# Patient Record
Sex: Male | Born: 1995 | Hispanic: Yes | Marital: Married | Smoking: Never smoker
Health system: Southern US, Community
[De-identification: ages and names within clinical notes are randomized; demographics above are authoritative.]

## PROBLEM LIST (undated history)

## (undated) DIAGNOSIS — T4145XA Adverse effect of unspecified anesthetic, initial encounter: Secondary | ICD-10-CM

## (undated) DIAGNOSIS — M549 Dorsalgia, unspecified: Secondary | ICD-10-CM

## (undated) DIAGNOSIS — T8859XA Other complications of anesthesia, initial encounter: Secondary | ICD-10-CM

---
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
## 2015-04-14 ENCOUNTER — Other Ambulatory Visit: Payer: Self-pay | Admitting: Family Medicine

## 2015-04-14 ENCOUNTER — Ambulatory Visit
Admission: RE | Admit: 2015-04-14 | Discharge: 2015-04-14 | Disposition: A | Discharge status: Home / Self Care | Payer: BLUE CROSS/BLUE SHIELD | Source: Ambulatory Visit | Attending: Family Medicine | Admitting: Family Medicine

## 2015-04-14 DIAGNOSIS — S93402A Sprain of unspecified ligament of left ankle, initial encounter: Secondary | ICD-10-CM | POA: Insufficient documentation

## 2015-04-14 DIAGNOSIS — M25472 Effusion, left ankle: Secondary | ICD-10-CM | POA: Insufficient documentation

## 2017-07-02 IMAGING — CR DG ANKLE COMPLETE 3+V*L*
1 series · 3 of 3 positions shown · non-contrast
Comparison: None

CLINICAL DATA: Stepped in a hole while running around outside,
tripped, LEFT ankle sprain, initial encounter, pain primarily
medially

EXAM:
LEFT ANKLE COMPLETE - 3+ VIEW

[Series 1: dg ankle complete left · 0.14mm/px · 3 of 3 slices shown]
[im 1/3]
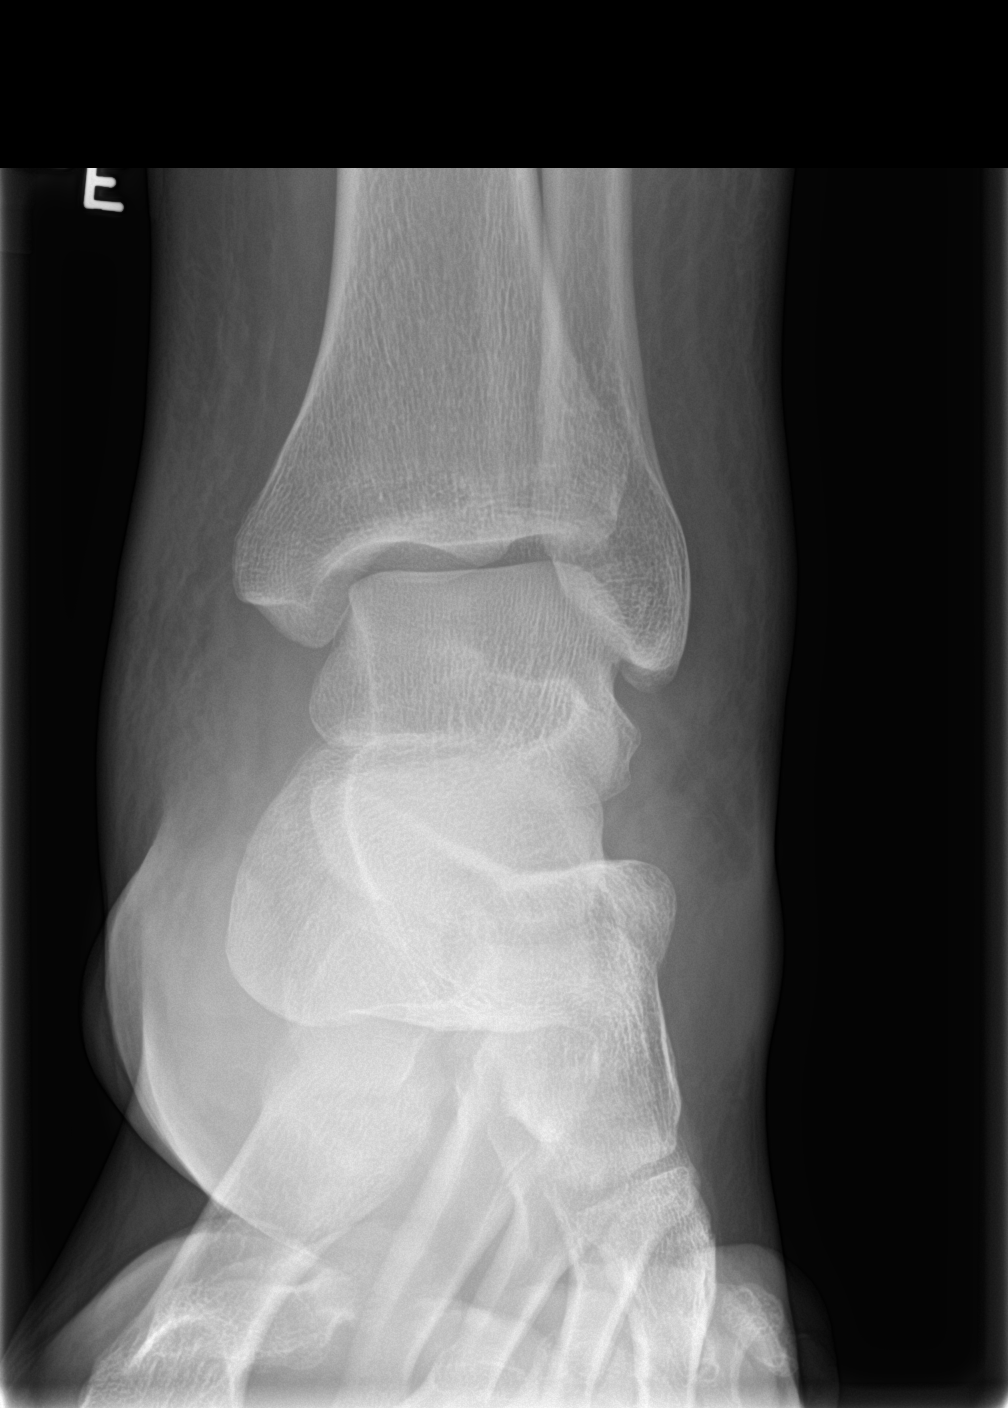
[im 2/3]
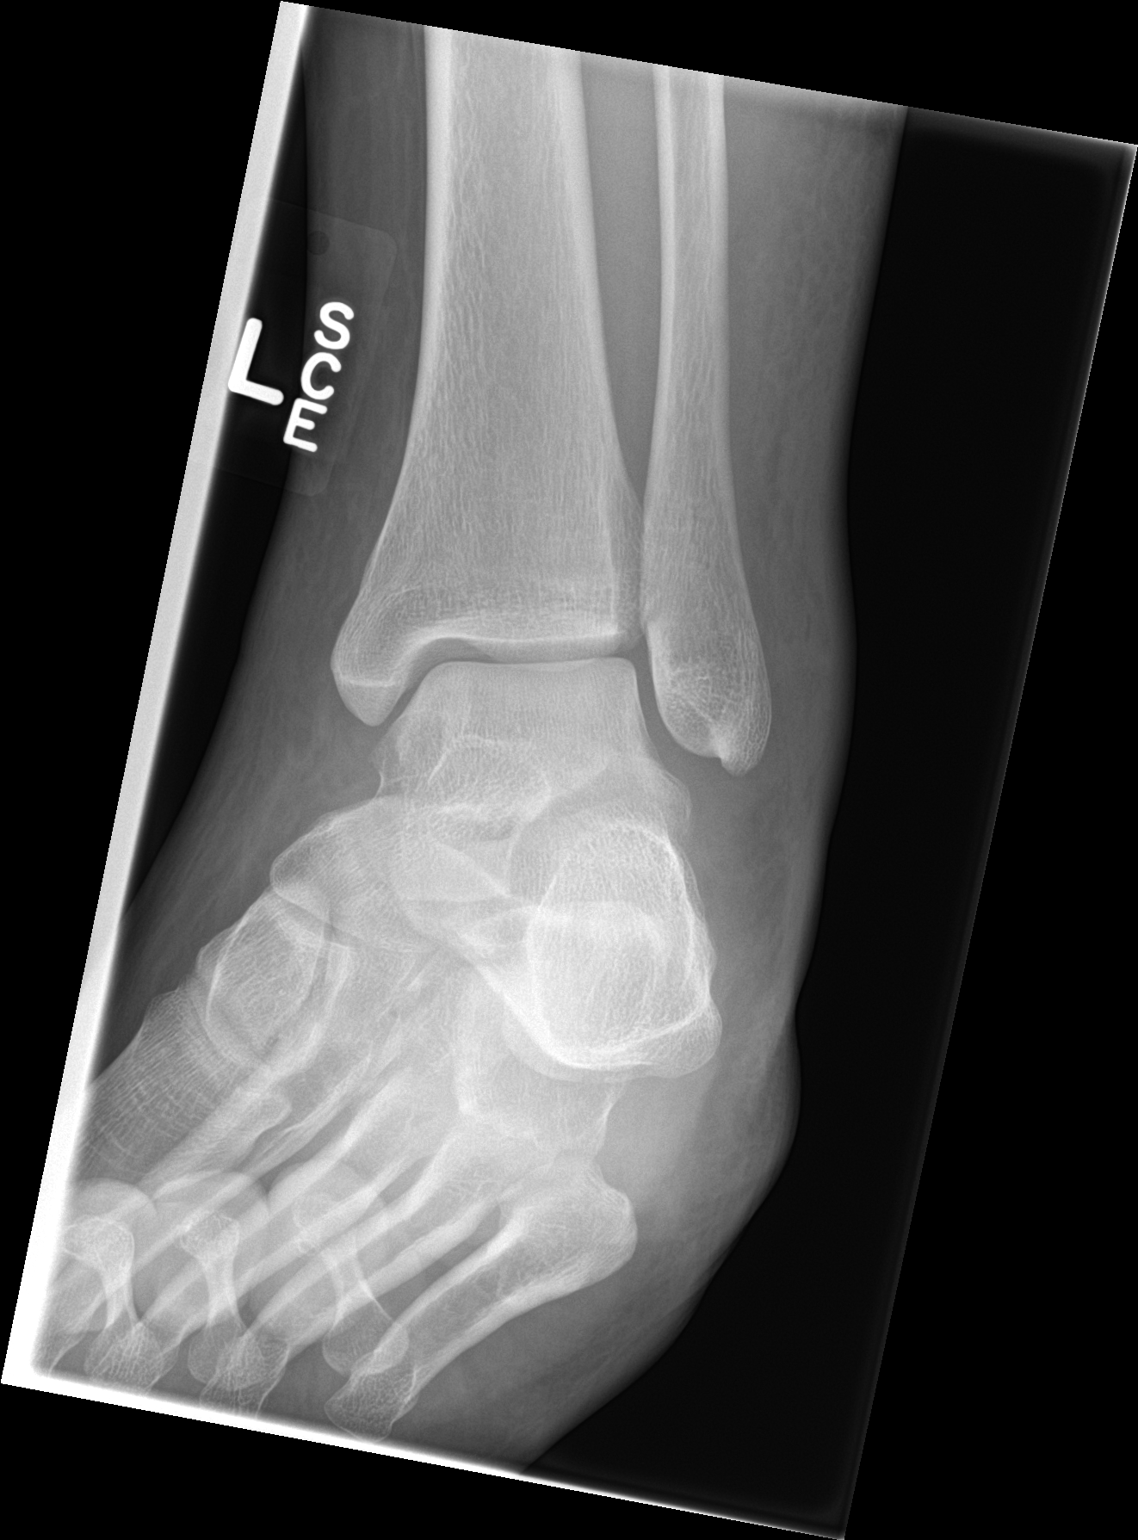
[im 3/3]
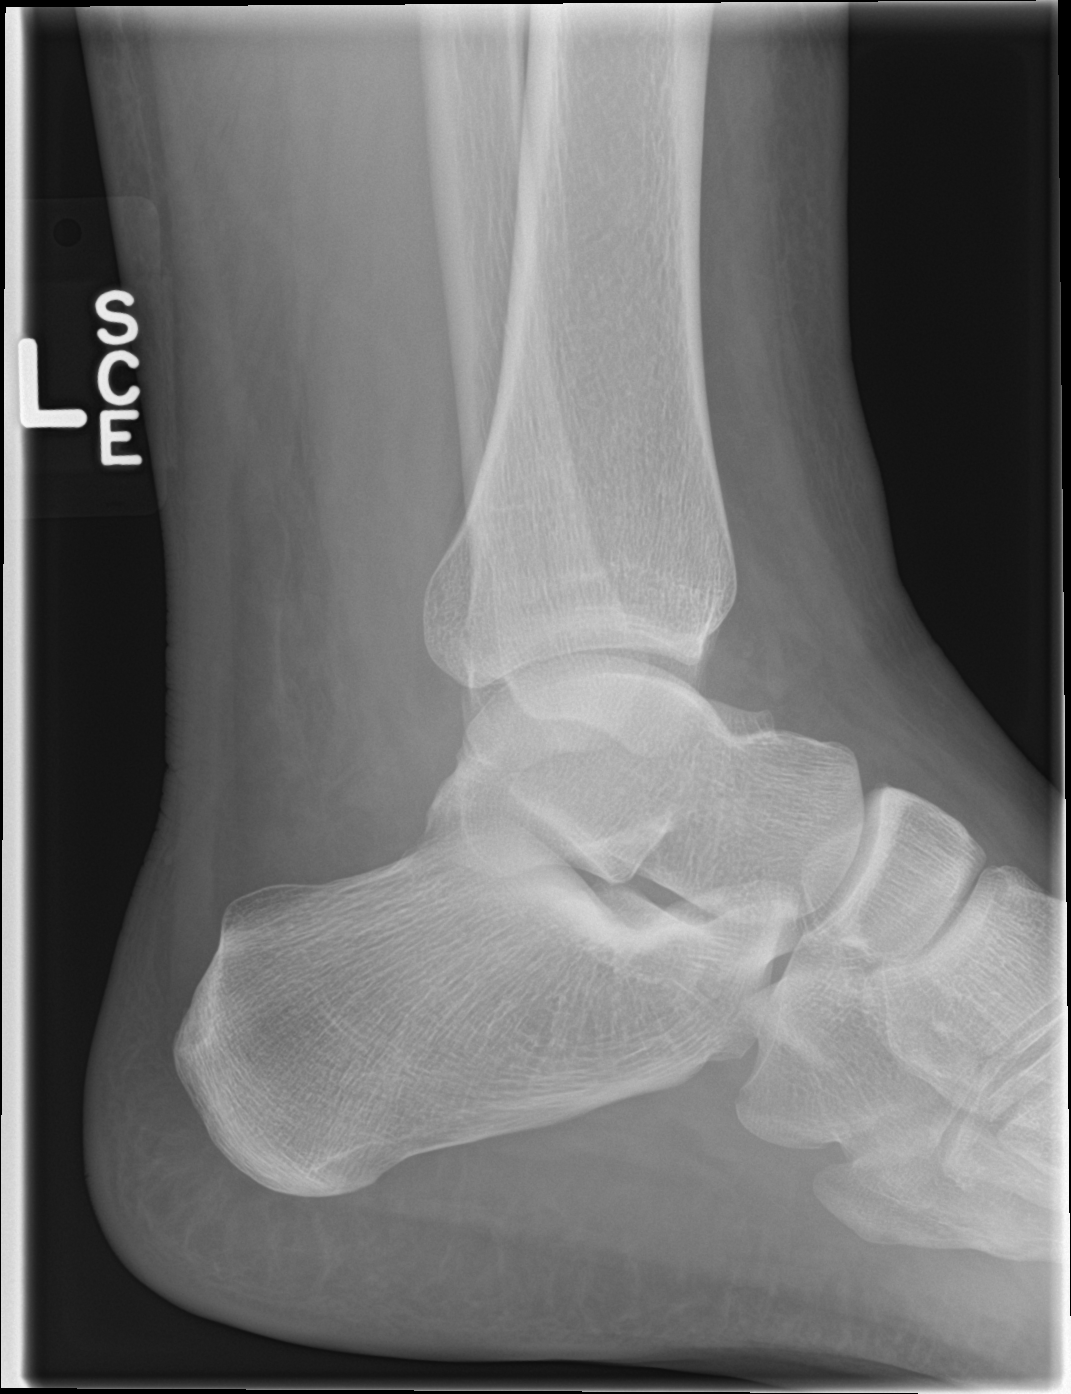

[3 of 3 positions shown; findings below may reference images not displayed]

FINDINGS: Diffuse soft tissue swelling LEFT ankle and distal lower leg.

Osseous mineralization normal.

Joint spaces preserved.

No acute fracture, dislocation, or bone destruction.
IMPRESSION: Soft tissue swelling without acute bony abnormalities.

## 2018-05-31 ENCOUNTER — Other Ambulatory Visit: Payer: Self-pay

## 2018-05-31 ENCOUNTER — Emergency Department
Admission: EM | Admit: 2018-05-31 | Discharge: 2018-06-01 | Disposition: A | Discharge status: Home / Self Care | Payer: BLUE CROSS/BLUE SHIELD | Attending: Emergency Medicine | Admitting: Emergency Medicine

## 2018-05-31 DIAGNOSIS — M62830 Muscle spasm of back: Secondary | ICD-10-CM | POA: Diagnosis not present

## 2018-05-31 DIAGNOSIS — M546 Pain in thoracic spine: Secondary | ICD-10-CM | POA: Diagnosis present

## 2018-05-31 MED ORDER — KETOROLAC TROMETHAMINE 30 MG/ML IJ SOLN
30.0000 mg | Freq: Once | INTRAMUSCULAR | Status: AC
  Administered 2018-06-01: 30 mg via INTRAMUSCULAR
  Filled 2018-05-31: qty 1

## 2018-05-31 MED ORDER — ORPHENADRINE CITRATE 30 MG/ML IJ SOLN
60.0000 mg | Freq: Once | INTRAMUSCULAR | Status: AC
  Administered 2018-06-01: 60 mg via INTRAMUSCULAR
  Filled 2018-05-31: qty 2

## 2018-05-31 NOTE — ED Notes (Signed)
Pt states that his upper back is in pain. 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 Family at bedside.

## 2018-05-31 NOTE — ED Provider Notes (Signed)
[facility] Emergency Department Provider Note  ____________________________________________  Time seen: Approximately 11:40 PM  I have reviewed the triage vital signs and the nursing notes.   HISTORY  Chief Complaint Back Pain    HPI Vincent Lopez is a 23 y.o. male who presents the emergency department complaining of mid back pain.  Patient reports that he has a tight/pulling sensation to the thoracic spine.  Patient reports that he does have intermittent back issues as he frequently lifts 50 to 85 pound objects at work.  Patient reports that typically he can rest his back, take Aleve and symptoms will improve.  Tonight symptoms persisted even after taking Aleve.  He denies any radicular symptoms in the upper or lower extremity, bowel or bladder dysfunction, saddle anesthesia or paresthesias.  No direct trauma to his back.  Patient denies any chest pain, shortness of breath, abdominal pain, urinary or GI symptoms.    History reviewed. No pertinent past medical history.  There are no active problems to display for this patient.   History reviewed. No pertinent surgical history.  Prior to Admission medications   Medication Sig Start Date End Date Taking? Authorizing Provider  meloxicam (MOBIC) 15 MG tablet Take 1 tablet (15 mg total) by mouth daily. 06/01/18   Dwaine Pringle, Delorise Royals, PA-C  methocarbamol (ROBAXIN) 500 MG tablet Take 1 tablet (500 mg total) by mouth 4 (four) times daily. 06/01/18   Stefon Ramthun, Delorise Royals, PA-C    Allergies Patient has no known allergies.  No family history on file.  Social History Social History   Tobacco Use  . Smoking status: Not on file  Substance Use Topics  . Alcohol use: Not on file  . Drug use: Not on file     Review of Systems  Constitutional: No fever/chills Eyes: No visual changes.  Cardiovascular: no chest pain. Respiratory: no cough. No SOB. Gastrointestinal: No abdominal pain.  No nausea, no  vomiting.  No diarrhea.  No constipation. Genitourinary: Negative for dysuria. No hematuria  Musculoskeletal: Positive for mid back pain with no radiation. Skin: Negative for rash, abrasions, lacerations, ecchymosis. Neurological: Negative for headaches, focal weakness or numbness. 10-point ROS otherwise negative.  ____________________________________________   PHYSICAL EXAM:  VITAL SIGNS: ED Triage Vitals  Enc Vitals Group     BP 05/31/18 2232 (!) 178/111     Pulse Rate 05/31/18 2232 77     Resp 05/31/18 2232 18     Temp 05/31/18 2232 98.1 F (36.7 C)     Temp Source 05/31/18 2232 Oral     SpO2 05/31/18 2232 100 %     Weight 05/31/18 2231 247 lb (112 kg)     Height 05/31/18 2231 5\' 8"  (1.727 m)     Head Circumference --      Peak Flow --      Pain Score 05/31/18 2230 10     Pain Loc --      Pain Edu? --      Excl. in [place]? --      Constitutional: Alert and oriented. Well appearing and in no acute distress. Eyes: Conjunctivae are normal. PERRL. EOMI. Head: Atraumatic. Neck: No stridor.  No cervical spine tenderness to palpation.  Cardiovascular: Normal rate, regular rhythm. Normal S1 and S2.  Good peripheral circulation. Respiratory: Normal respiratory effort without tachypnea or retractions. Lungs CTAB. Good air entry to the bases with no decreased or absent breath sounds. Gastrointestinal: Bowel sounds 4 quadrants. Soft and nontender to palpation. No guarding  or rigidity. No palpable masses. No distention. No CVA tenderness. Musculoskeletal: Full range of motion to all extremities. No gross deformities appreciated.  Visualization of the thoracic spine reveals no gross signs of trauma with edema, lacerations, abrasions or ecchymosis.  No deformity.  Full range of motion to the thoracic spine.  Patient reports diffuse tenderness throughout the thoracic spine both midline and paraspinal muscle region.  No palpable abnormality or step-off.  No specific point tenderness on  palpation.  Radial pulses intact bilaterally.  Dorsalis pedis pulse intact bilaterally.  Sensation intact all 4 digits with no acute deficits. Neurologic:  Normal speech and language. No gross focal neurologic deficits are appreciated.  Skin:  Skin is warm, dry and intact. No rash noted. Psychiatric: Mood and affect are normal. Speech and behavior are normal. Patient exhibits appropriate insight and judgement.   ____________________________________________   LABS (all labs ordered are listed, but only abnormal results are displayed)  Labs Reviewed - No data to display ____________________________________________  EKG   ____________________________________________  RADIOLOGY   No results found.  ____________________________________________    PROCEDURES  Procedure(s) performed:    Procedures    Medications  ketorolac (TORADOL) 30 MG/ML injection 30 mg (30 mg Intramuscular Given 06/01/18 0012)  orphenadrine (NORFLEX) injection 60 mg (60 mg Intramuscular Given 06/01/18 0012)     ____________________________________________   INITIAL IMPRESSION / ASSESSMENT AND PLAN / ED COURSE  Pertinent labs & imaging results that were available during my care of the patient were reviewed by me and considered in my medical decision making (see chart for details).  Review of the [place] CSRS was performed in accordance of the [facility] prior to dispensing any controlled drugs.      Patient's diagnosis is consistent with thoracic muscle spasm and strain.  Patient presents emergency department with thoracic back pain.  Patient performs routine heavy lifting at work.  There was no acute trauma.  No acute findings on exam.  No indication for imaging at this time.  Patient be treated some to medically with Toradol and Norflex in the emergency department and a prescription for meloxicam and Robaxin at home.  Follow-up primary care as needed..  Patient is given ED precautions to return to the ED for  any worsening or new symptoms.     ____________________________________________  FINAL CLINICAL IMPRESSION(S) / ED DIAGNOSES  Final diagnoses:  Muscle spasm of back      NEW MEDICATIONS STARTED DURING THIS VISIT:  ED Discharge Orders         Ordered    meloxicam (MOBIC) 15 MG tablet  Daily     06/01/18 0010    methocarbamol (ROBAXIN) 500 MG tablet  4 times daily     06/01/18 0010              This chart was dictated using voice recognition software/Dragon. Despite best efforts to proofread, errors can occur which can change the meaning. Any change was purely unintentional.    Racheal Patches, PA-C 06/01/18 0014    Phineas Semen, MD 06/01/18 [phone]

## 2018-05-31 NOTE — ED Triage Notes (Signed)
Patient reports upper back pain  

## 2018-06-01 ENCOUNTER — Encounter: Payer: Self-pay | Admitting: Emergency Medicine

## 2018-06-01 MED ORDER — METHOCARBAMOL 500 MG PO TABS: 500.0000 mg | ORAL_TABLET | Freq: Four times a day (QID) | ORAL | 0 refills | Status: DC

## 2018-06-01 MED ORDER — MELOXICAM 15 MG PO TABS: 15.0000 mg | ORAL_TABLET | Freq: Every day | ORAL | 0 refills | Status: DC

## 2019-02-21 ENCOUNTER — Emergency Department: Payer: BC Managed Care – PPO

## 2019-02-21 ENCOUNTER — Emergency Department
Admission: EM | Admit: 2019-02-21 | Discharge: 2019-02-21 | Disposition: A | Discharge status: Home / Self Care | Payer: BC Managed Care – PPO | Attending: Emergency Medicine | Admitting: Emergency Medicine

## 2019-02-21 ENCOUNTER — Encounter: Payer: Self-pay | Admitting: Emergency Medicine

## 2019-02-21 ENCOUNTER — Other Ambulatory Visit: Payer: Self-pay

## 2019-02-21 DIAGNOSIS — K8020 Calculus of gallbladder without cholecystitis without obstruction: Secondary | ICD-10-CM | POA: Diagnosis not present

## 2019-02-21 DIAGNOSIS — M5489 Other dorsalgia: Secondary | ICD-10-CM | POA: Diagnosis present

## 2019-02-21 DIAGNOSIS — R1011 Right upper quadrant pain: Secondary | ICD-10-CM | POA: Insufficient documentation

## 2019-02-21 DIAGNOSIS — M549 Dorsalgia, unspecified: Secondary | ICD-10-CM

## 2019-02-21 DIAGNOSIS — R1114 Bilious vomiting: Secondary | ICD-10-CM

## 2019-02-21 HISTORY — DX: Dorsalgia, unspecified: M54.9

## 2019-02-21 LAB — COMPREHENSIVE METABOLIC PANEL
ALT: 35 U/L (ref 0–44)
AST: 28 U/L (ref 15–41)
Albumin: 4.9 g/dL (ref 3.5–5.0)
Alkaline Phosphatase: 60 U/L (ref 38–126)
Anion gap: 11 (ref 5–15)
BUN: 9 mg/dL (ref 6–20)
CO2: 23 mmol/L (ref 22–32)
Calcium: 9.6 mg/dL (ref 8.9–10.3)
Chloride: 103 mmol/L (ref 98–111)
Creatinine, Ser: 0.72 mg/dL (ref 0.61–1.24)
GFR calc Af Amer: >60 mL/min (ref >60)
GFR calc non Af Amer: >60 mL/min (ref >60)
Glucose, Bld: 126 mg/dL — ABNORMAL HIGH (ref 70–99)
Potassium: 4 mmol/L (ref 3.5–5.1)
Sodium: 137 mmol/L (ref 135–145)
Total Bilirubin: 0.9 mg/dL (ref 0.3–1.2)
Total Protein: 8.6 g/dL — ABNORMAL HIGH (ref 6.5–8.1)

## 2019-02-21 LAB — CBC
HCT: 46 % (ref 39.0–52.0)
Hemoglobin: 16.2 g/dL (ref 13.0–17.0)
MCH: 29.9 pg (ref 26.0–34.0)
MCHC: 35.2 g/dL (ref 30.0–36.0)
MCV: 84.9 fL (ref 80.0–100.0)
Platelets: 273 10*3/uL (ref 150–400)
RBC: 5.42 MIL/uL (ref 4.22–5.81)
RDW: 12.9 % (ref 11.5–15.5)
WBC: 11 10*3/uL — ABNORMAL HIGH (ref 4.0–10.5)
nRBC: 0 % (ref 0.0–0.2)

## 2019-02-21 LAB — URINALYSIS, COMPLETE (UACMP) WITH MICROSCOPIC
Bacteria, UA: NONE SEEN
Bilirubin Urine: NEGATIVE
Glucose, UA: NEGATIVE mg/dL
Hgb urine dipstick: NEGATIVE
Ketones, ur: NEGATIVE mg/dL
Leukocytes,Ua: NEGATIVE
Nitrite: NEGATIVE
Protein, ur: 30 mg/dL — AB
Specific Gravity, Urine: 1.025 (ref 1.005–1.030)
Squamous Epithelial / HPF: NONE SEEN (ref 0–5)
pH: 6 (ref 5.0–8.0)

## 2019-02-21 LAB — LIPASE, BLOOD: Lipase: 23 U/L (ref 11–51)

## 2019-02-21 MED ORDER — ONDANSETRON 4 MG PO TBDP: 4.0000 mg | ORAL_TABLET | Freq: Three times a day (TID) | ORAL | 0 refills | Status: DC | PRN

## 2019-02-21 MED ORDER — SODIUM CHLORIDE 0.9% FLUSH: 3.0000 mL | Freq: Once | INTRAVENOUS | Status: DC

## 2019-02-21 MED ORDER — MELOXICAM 15 MG PO TABS: 15.0000 mg | ORAL_TABLET | Freq: Every day | ORAL | 0 refills | Status: DC

## 2019-02-21 NOTE — ED Notes (Signed)
Pt not able to provide urine sample at this time. Pt given cup for specimen and told to collect one when able.

## 2019-02-21 NOTE — ED Triage Notes (Signed)
Pt to ED via POV stating that he went to [facility] for upper back pain and abdominal pain. Pt states that he has intermittent back pain and has been seen for this before, pt took flexeril this morning but it has not helped. Pt states that he is also having upper abdominal pain that started this morning and states that he vomited x 5-6 times. Pt states that he had cold chills and was sweating. Pt is in NAD at this time.

## 2019-02-21 NOTE — ED Provider Notes (Signed)
[facility] Emergency Department Provider Note  ____________________________________________  Time seen: Approximately 2:46 PM  I have reviewed the triage vital signs and the nursing notes.   HISTORY  Chief Complaint Back Pain and Abdominal Pain    HPI Vincent Lopez is a 23 y.o. male who presents to the emergency department complaining of right upper quadrant abdominal pain and back pain.  Patient states that he has a history of recurrent back pain, which has been "flared" for the past 2 to 3 days.   Patient states that he typically takes Flexeril and the pain goes away.  As the pain was not improving and he started to develop abdominal pain he was seen in urgent care.  Patient was sent to the emergency department for further evaluation.  Patient has had 5-6 episodes of bilious vomit today.  No history of GI problems.  Patient denies any history of gallbladder issues.  Patient denies any other abdominal pain.  No fevers or chills.  Patient denies any respiratory symptoms.  Patient denies any hematemesis.  No diarrhea or constipation.  Other than right upper quadrant pain no other abdominal pain.  Patient denies any dysuria, polyuria, hematuria.  No radicular symptoms in the upper or lower extremities.  Patient took Flexeril today but no other medications for this symptom.        Past Medical History:  Diagnosis Date  . Back pain     There are no active problems to display for this patient.   History reviewed. No pertinent surgical history.  Prior to Admission medications   Medication Sig Start Date End Date Taking? Authorizing Provider  meloxicam (MOBIC) 15 MG tablet Take 1 tablet (15 mg total) by mouth daily. 02/21/19   Aaiden Depoy, Delorise Royals, PA-C  methocarbamol (ROBAXIN) 500 MG tablet Take 1 tablet (500 mg total) by mouth 4 (four) times daily. 06/01/18   Savi Lastinger, Delorise Royals, PA-C  ondansetron (ZOFRAN-ODT) 4 MG disintegrating tablet Take 1 tablet (4 mg  total) by mouth every 8 (eight) hours as needed for nausea or vomiting. 02/21/19   Lyna Laningham, Delorise Royals, PA-C    Allergies Patient has no known allergies.  No family history on file.  Social History Social History   Tobacco Use  . Smoking status: Not on file  . Smokeless tobacco: Never Used  Substance Use Topics  . Alcohol use: Yes  . Drug use: Yes    Types: Marijuana    Comment: Occasional     Review of Systems  Constitutional: No fever/chills Eyes: No visual changes. No discharge ENT: No upper respiratory complaints. Cardiovascular: no chest pain. Respiratory: no cough. No SOB. Gastrointestinal: Right upper quadrant abdominal pain.  Positive for nausea with bilious vomit today.  No diarrhea.  No constipation. Genitourinary: Negative for dysuria. No hematuria Musculoskeletal: Positive for right-sided middle back pain Skin: Negative for rash, abrasions, lacerations, ecchymosis. Neurological: Negative for headaches, focal weakness or numbness. 10-point ROS otherwise negative.  ____________________________________________   PHYSICAL EXAM:  VITAL SIGNS: ED Triage Vitals  Enc Vitals Group     BP 02/21/19 1217 (!) 158/82     Pulse Rate 02/21/19 1217 76     Resp 02/21/19 1217 16     Temp 02/21/19 1217 97.7 F (36.5 C)     Temp Source 02/21/19 1217 Oral     SpO2 02/21/19 1217 99 %     Weight 02/21/19 1214 230 lb (104.3 kg)     Height 02/21/19 1214 5\' 8"  (1.727 m)  Head Circumference --      Peak Flow --      Pain Score 02/21/19 1214 7     Pain Loc --      Pain Edu? --      Excl. in [place]? --      Constitutional: Alert and oriented. Well appearing and in no acute distress. Eyes: Conjunctivae are normal. PERRL. EOMI. Head: Atraumatic. ENT:      Ears:       Nose: No congestion/rhinnorhea.      Mouth/Throat: Mucous membranes are moist.  Neck: No stridor.  Neck supple full range of motion Hematological/Lymphatic/Immunilogical: No cervical  lymphadenopathy. Cardiovascular: Normal rate, regular rhythm. Normal S1 and S2.  Good peripheral circulation. Respiratory: Normal respiratory effort without tachypnea or retractions. Lungs CTAB. Good air entry to the bases with no decreased or absent breath sounds. Gastrointestinal: No visible abdominal wall findings.  Bowel sounds 4 quadrants. Soft to palpation all quadrants.  Patient is tender to palpation in the right upper quadrant.  No other tenderness to palpation.  Positive Murphy sign.. No guarding or rigidity. No palpable masses. No distention. No CVA tenderness. Musculoskeletal: Full range of motion to all extremities. No gross deformities appreciated.  Examination of the back reveals no visible signs of trauma.  Full range of motion to the cervical, thoracic and lumbar spine. Neurologic:  Normal speech and language. No gross focal neurologic deficits are appreciated.  Skin:  Skin is warm, dry and intact. No rash noted. Psychiatric: Mood and affect are normal. Speech and behavior are normal. Patient exhibits appropriate insight and judgement.   ____________________________________________   LABS (all labs ordered are listed, but only abnormal results are displayed)  Labs Reviewed  COMPREHENSIVE METABOLIC PANEL - Abnormal; Notable for the following components:      Result Value   Glucose, Bld 126 (*)    Total Protein 8.6 (*)    All other components within normal limits  CBC - Abnormal; Notable for the following components:   WBC 11.0 (*)    All other components within normal limits  URINALYSIS, COMPLETE (UACMP) WITH MICROSCOPIC - Abnormal; Notable for the following components:   Color, Urine YELLOW (*)    APPearance CLEAR (*)    Protein, ur 30 (*)    All other components within normal limits  LIPASE, BLOOD   ____________________________________________  EKG   ____________________________________________  RADIOLOGY I personally viewed and evaluated these images as part  of my medical decision making, as well as reviewing the written report by the radiologist.  US Abdomen Limited Ruq  Result Date: 02/21/2019 CLINICAL DATA:  Right upper quadrant pain and vomiting EXAM: ULTRASOUND ABDOMEN LIMITED RIGHT UPPER QUADRANT COMPARISON:  None. FINDINGS: Gallbladder: There is a small amount of gallbladder sludge and small stones. No gallbladder wall thickening. No sonographic Murphy sign noted by sonographer. Common bile duct: Diameter: 0.4 cm. Liver: No focal lesion identified. Within normal limits in parenchymal echogenicity. Portal vein is patent on color Doppler imaging with normal direction of blood flow towards the liver. Other: None. IMPRESSION: Cholelithiasis without other evidence of cholecystitis. Electronically Signed   By: Audie Pinto M.D.   On: 02/21/2019 15:46    ____________________________________________    PROCEDURES  Procedure(s) performed:    Procedures    Medications  sodium chloride flush (NS) 0.9 % injection 3 mL (has no administration in time range)     ____________________________________________   INITIAL IMPRESSION / ASSESSMENT AND PLAN / ED COURSE  Pertinent labs & imaging  results that were available during my care of the patient were reviewed by me and considered in my medical decision making (see chart for details).  Review of the [place] CSRS was performed in accordance of the [facility] prior to dispensing any controlled drugs.           Patient's diagnosis is consistent with cholelithiasis without cholecystitis and back pain.  Patient presents emergency department for evaluation from urgent care for abdominal pain, emesis and back pain.  On exam, patient was tender with positive Murphy sign to the right upper quadrant.  With bilious vomit and symptoms I suspected gallbladder etiology.  Differential included gastritis, pancreatitis, cholecystitis, cholelithiasis.  Ultrasound reveals findings consistent with cholelithiasis  without cholecystitis.  Patient has had no return of emesis or significant abdominal pain in the emergency department.  Patient will be prescribed Zofran for symptom relief.  Meloxicam to assist with both back pain and pain from cholelithiasis.  Discussed follow-up to include elective surgery if patient had worsening symptoms, however discussed foods to avoid or improve symptoms.  Patient is given instructions to return to emergency department for any worsening pain, inability to maintain oral hydration or solids, fevers.  Patient verbalizes understanding of same.  Patient to follow-up with general surgery or primary care as needed.  Patient is given ED precautions to return to the ED for any worsening or new symptoms.     ____________________________________________  FINAL CLINICAL IMPRESSION(S) / ED DIAGNOSES  Final diagnoses:  Bilious vomiting  RUQ pain  Calculus of gallbladder without cholecystitis without obstruction  Mid back pain on right side      NEW MEDICATIONS STARTED DURING THIS VISIT:  ED Discharge Orders         Ordered    ondansetron (ZOFRAN-ODT) 4 MG disintegrating tablet  Every 8 hours PRN     02/21/19 1614    meloxicam (MOBIC) 15 MG tablet  Daily     02/21/19 1614              This chart was dictated using voice recognition software/Dragon. Despite best efforts to proofread, errors can occur which can change the meaning. Any change was purely unintentional.    Racheal PatchesCuthriell, Jeniel Slauson D, PA-C 02/21/19 1650    Sharman CheekStafford, Phillip, MD 02/25/19 [phone]

## 2019-02-21 NOTE — ED Notes (Signed)
ED Provider at bedside. 

## 2019-02-21 NOTE — ED Notes (Signed)
Pt c/o chills/sweats, back pain (hx of same), and abd pain with emesis today. Patient ambulatory into room with NAD noted

## 2019-04-16 HISTORY — PX: CHOLECYSTECTOMY: SHX55

## 2019-08-19 ENCOUNTER — Other Ambulatory Visit: Payer: Self-pay | Admitting: Radiology

## 2019-08-19 ENCOUNTER — Ambulatory Visit (INDEPENDENT_AMBULATORY_CARE_PROVIDER_SITE_OTHER): Payer: BC Managed Care – PPO | Admitting: Urology

## 2019-08-19 ENCOUNTER — Other Ambulatory Visit: Payer: Self-pay

## 2019-08-19 VITALS — BP 148/77 | HR 69 | Ht 68.0 in | Wt 241.0 lb

## 2019-08-19 DIAGNOSIS — N471 Phimosis: Secondary | ICD-10-CM

## 2019-08-19 NOTE — Progress Notes (Signed)
   08/19/2019 4:00 PM   Vincent Lopez 04/21/1995 [id number]  Referring provider: Aycock, Ngwe A, MD [address] [place],  [place] [address]  Chief Complaint  Patient presents with  . Paraphimosis    HPI: 23 yo male who states towards the end of 2020 he has noticed progressive tightness of his foreskin with difficulty retracting.  After intercourse he will also have difficulty replacing the foreskin back over the head of the penis.  He also notes some pain with intercourse.  He does note occasional fissuring and irritation.  No history of diabetes.  Denies prior history of balanitis.  No voiding complaints.  Urologic history.   PMH: Past Medical History:  Diagnosis Date  . Back pain     Surgical History: No past surgical history on file.  Home Medications:  Allergies as of 08/19/2019   No Known Allergies     Medication List       Accurate as of Aug 19, 2019  4:00 PM. If you have any questions, ask your nurse or doctor.        clotrimazole 1 % cream Commonly known as: LOTRIMIN APPLY CREAM TOPICALLY TO AFFECTED AREA TWICE DAILY FOR FUNGAL INFECTION   meloxicam 15 MG tablet Commonly known as: MOBIC Take 1 tablet (15 mg total) by mouth daily.   methocarbamol 500 MG tablet Commonly known as: Robaxin Take 1 tablet (500 mg total) by mouth 4 (four) times daily.   omeprazole 40 MG capsule Commonly known as: PRILOSEC Take 40 mg by mouth daily.   ondansetron 4 MG disintegrating tablet Commonly known as: ZOFRAN-ODT Take 1 tablet (4 mg total) by mouth every 8 (eight) hours as needed for nausea or vomiting.       Allergies: No Known Allergies  Family History: No family history on file.  Social History:  does not have a smoking history on file. He has never used smokeless tobacco. He reports current alcohol use. He reports current drug use. Drug: Marijuana.   Physical Exam: BP (!) 148/77   Pulse 69   Ht 5' 8" (1.727 m)   Wt 241 lb (109.3 kg)   BMI  36.64 kg/m   Constitutional:  Alert and oriented, No acute distress. HEENT: [place] AT, moist mucus membranes.  Trachea midline, no masses. Cardiovascular: No clubbing, cyanosis, or edema.  RRR Respiratory: Normal respiratory effort, no increased work of breathing.  Clear GU: Phallus uncircumcised.  Foreskin is tight but retractable.  Inner prepuce blanched appearing.  Testes descended bilaterally without masses or tenderness. Skin: No rashes, bruises or suspicious lesions. Neurologic: Grossly intact, no focal deficits, moving all 4 extremities. Psychiatric: Normal mood and affect.   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  We discussed options of observation, topical steroids and circumcision.    He would like to schedule circumcision.  The procedure was discussed in detail including potential risks of bleeding, infection and scar tissue.  The postoperative care and follow-up was discussed including abstinence for 30 days after the procedure.  He indicated all questions were answered and desires to proceed.   [name], MD  [facility] [address], [address] [place], [place] [address] [phone]  

## 2019-08-19 NOTE — H&P (View-Only) (Signed)
   08/19/2019 4:00 PM   Vincent Lopez [birth year] [id number]  Referring provider: Emogene Morgan, MD [address] [place],  [place] [address]  Chief Complaint  Patient presents with  . Paraphimosis    HPI: 24 yo male who states towards the end of 2020 he has noticed progressive tightness of his foreskin with difficulty retracting.  After intercourse he will also have difficulty replacing the foreskin back over the head of the penis.  He also notes some pain with intercourse.  He does note occasional fissuring and irritation.  No history of diabetes.  Denies prior history of balanitis.  No voiding complaints.  Urologic history.   PMH: Past Medical History:  Diagnosis Date  . Back pain     Surgical History: No past surgical history on file.  Home Medications:  Allergies as of 08/19/2019   No Known Allergies     Medication List       Accurate as of Aug 19, 2019  4:00 PM. If you have any questions, ask your nurse or doctor.        clotrimazole 1 % cream Commonly known as: LOTRIMIN APPLY CREAM TOPICALLY TO AFFECTED AREA TWICE DAILY FOR FUNGAL INFECTION   meloxicam 15 MG tablet Commonly known as: MOBIC Take 1 tablet (15 mg total) by mouth daily.   methocarbamol 500 MG tablet Commonly known as: Robaxin Take 1 tablet (500 mg total) by mouth 4 (four) times daily.   omeprazole 40 MG capsule Commonly known as: PRILOSEC Take 40 mg by mouth daily.   ondansetron 4 MG disintegrating tablet Commonly known as: ZOFRAN-ODT Take 1 tablet (4 mg total) by mouth every 8 (eight) hours as needed for nausea or vomiting.       Allergies: No Known Allergies  Family History: No family history on file.  Social History:  does not have a smoking history on file. He has never used smokeless tobacco. He reports current alcohol use. He reports current drug use. Drug: Marijuana.   Physical Exam: BP (!) 148/77   Pulse 69   Ht 5\' 8"  (1.727 m)   Wt 241 lb (109.3 kg)   BMI  36.64 kg/m   Constitutional:  Alert and oriented, No acute distress. HEENT: [place] AT, moist mucus membranes.  Trachea midline, no masses. Cardiovascular: No clubbing, cyanosis, or edema.  RRR Respiratory: Normal respiratory effort, no increased work of breathing.  Clear GU: Phallus uncircumcised.  Foreskin is tight but retractable.  Inner prepuce blanched appearing.  Testes descended bilaterally without masses or tenderness. Skin: No rashes, bruises or suspicious lesions. Neurologic: Grossly intact, no focal deficits, moving all 4 extremities. Psychiatric: Normal mood and affect.   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  We discussed options of observation, topical steroids and circumcision.    He would like to schedule circumcision.  The procedure was discussed in detail including potential risks of bleeding, infection and scar tissue.  The postoperative care and follow-up was discussed including abstinence for 30 days after the procedure.  He indicated all questions were answered and desires to proceed.   , MD  [facility] [address], [address] [place], [place] [phone]

## 2019-08-20 ENCOUNTER — Encounter: Payer: Self-pay | Admitting: Urology

## 2019-08-26 ENCOUNTER — Encounter
Admission: RE | Admit: 2019-08-26 | Discharge: 2019-08-26 | Disposition: A | Discharge status: Home / Self Care | Payer: BC Managed Care – PPO | Source: Ambulatory Visit | Attending: Urology | Admitting: Urology

## 2019-08-26 ENCOUNTER — Other Ambulatory Visit: Payer: Self-pay

## 2019-08-26 HISTORY — DX: Other complications of anesthesia, initial encounter: T88.59XA

## 2019-08-26 NOTE — Patient Instructions (Signed)
Your procedure is scheduled on: Tuesday Aug 31, 2019 Report to Day Surgery. To find out your arrival time please call [phone] between 1PM - 3PM on Monday 17, 2021.  Remember: Instructions that are not followed completely may result in serious medical risk,  up to and including death, or upon the discretion of your surgeon and anesthesiologist your  surgery may need to be rescheduled.     _X__ 1. Do not eat food after midnight the night before your procedure.                 No gum chewing or hard candies. You may drink clear liquids up to 2 hours                 before you are scheduled to arrive for your surgery- DO not drink clear                 liquids within 2 hours of the start of your surgery.                 Clear Liquids include:  water, apple juice without pulp, clear Gatorade, G2 or                  Gatorade Zero (avoid Red/Purple/Blue), Black Coffee or Tea (Do not add                 anything to coffee or tea).  __X__2.  On the morning of surgery brush your teeth with toothpaste and water, you                may rinse your mouth with mouthwash if you wish.  Do not swallow any toothpaste of mouthwash.     _X__ 3.  No Alcohol for 24 hours before or after surgery.   _X__ 4.  Do Not Smoke or use e-cigarettes For 24 Hours Prior to Your Surgery.                 Do not use any chewable tobacco products for at least 6 hours prior to                 Surgery.  _X__  5.  Do not use any recreational drugs (marijuana, cocaine, heroin, ecstacy, MDMA or other)                For at least one week prior to your surgery.  Combination of these drugs with anesthesia                May have life threatening results.  __x__  6.  Notify your doctor if there is any change in your medical condition      (cold, fever, infections).     Do not wear jewelry, make-up, hairpins, clips or nail polish. Do not wear lotions, powders, or perfumes. You may wear deodorant. Do  not shave 48 hours prior to surgery. Men may shave face and neck. Do not bring valuables to the hospital.    [facility] is not responsible for any belongings or valuables.  Contacts, dentures or bridgework may not be worn into surgery. Leave your suitcase in the car. After surgery it may be brought to your room. For patients admitted to the hospital, discharge time is determined by your treatment team.   Patients discharged the day of surgery will not be allowed to drive home.   Make arrangements for someone to be with you for the first  24 hours of your Same Day Discharge.     __x__ Take these medicines the morning of surgery with A SIP OF WATER:    1. None   __x__ Stop Anti-inflammatories    __x__ Stop supplements until after surgery.    __x__ Do not start any herbal supplements before your surgery.

## 2019-08-27 ENCOUNTER — Other Ambulatory Visit
Admission: RE | Admit: 2019-08-27 | Discharge: 2019-08-27 | Disposition: A | Discharge status: Home / Self Care | Payer: BC Managed Care – PPO | Source: Ambulatory Visit | Attending: Urology | Admitting: Urology

## 2019-08-27 DIAGNOSIS — Z01812 Encounter for preprocedural laboratory examination: Secondary | ICD-10-CM | POA: Diagnosis present

## 2019-08-27 DIAGNOSIS — Z20822 Contact with and (suspected) exposure to covid-19: Secondary | ICD-10-CM | POA: Diagnosis not present

## 2019-08-27 LAB — SARS CORONAVIRUS 2 (TAT 6-24 HRS): SARS Coronavirus 2: NEGATIVE

## 2019-08-31 ENCOUNTER — Other Ambulatory Visit: Payer: Self-pay

## 2019-08-31 ENCOUNTER — Telehealth: Payer: Self-pay | Admitting: Radiology

## 2019-08-31 ENCOUNTER — Ambulatory Visit: Payer: BC Managed Care – PPO | Admitting: Anesthesiology

## 2019-08-31 ENCOUNTER — Encounter
Admission: RE | Disposition: A | Discharge status: Home / Self Care | Payer: Self-pay | Source: Home / Self Care | Attending: Urology

## 2019-08-31 ENCOUNTER — Ambulatory Visit
Admission: RE | Admit: 2019-08-31 | Discharge: 2019-08-31 | Disposition: A | Discharge status: Home / Self Care | Payer: BC Managed Care – PPO | Attending: Urology | Admitting: Urology

## 2019-08-31 ENCOUNTER — Encounter: Payer: Self-pay | Admitting: Urology

## 2019-08-31 DIAGNOSIS — N471 Phimosis: Secondary | ICD-10-CM | POA: Insufficient documentation

## 2019-08-31 DIAGNOSIS — L900 Lichen sclerosus et atrophicus: Secondary | ICD-10-CM | POA: Insufficient documentation

## 2019-08-31 HISTORY — PX: CIRCUMCISION: SHX1350

## 2019-08-31 LAB — URINE DRUG SCREEN, QUALITATIVE (ARMC ONLY)
Amphetamines, Ur Screen: NOT DETECTED
Barbiturates, Ur Screen: NOT DETECTED
Benzodiazepine, Ur Scrn: NOT DETECTED
Cannabinoid 50 Ng, Ur ~~LOC~~: POSITIVE — AB
Cocaine Metabolite,Ur ~~LOC~~: NOT DETECTED
MDMA (Ecstasy)Ur Screen: NOT DETECTED
Methadone Scn, Ur: NOT DETECTED
Opiate, Ur Screen: NOT DETECTED
Phencyclidine (PCP) Ur S: NOT DETECTED
Tricyclic, Ur Screen: NOT DETECTED

## 2019-08-31 SURGERY — CIRCUMCISION, ADULT
Anesthesia: General | Site: Penis

## 2019-08-31 MED ORDER — FAMOTIDINE 20 MG PO TABS
ORAL_TABLET | ORAL | Status: AC
  Filled 2019-08-31: qty 1

## 2019-08-31 MED ORDER — EPHEDRINE SULFATE 50 MG/ML IJ SOLN
INTRAMUSCULAR | Status: DC | PRN
  Administered 2019-08-31 (×2): 10 mg via INTRAVENOUS

## 2019-08-31 MED ORDER — ACETAMINOPHEN 10 MG/ML IV SOLN
INTRAVENOUS | Status: DC | PRN
  Administered 2019-08-31: 1000 mg via INTRAVENOUS

## 2019-08-31 MED ORDER — FAMOTIDINE 20 MG PO TABS
20.0000 mg | ORAL_TABLET | Freq: Once | ORAL | Status: AC
  Administered 2019-08-31: 20 mg via ORAL

## 2019-08-31 MED ORDER — PROPOFOL 10 MG/ML IV BOLUS
INTRAVENOUS | Status: DC | PRN
  Administered 2019-08-31: 200 mg via INTRAVENOUS

## 2019-08-31 MED ORDER — OXYCODONE-ACETAMINOPHEN 5-325 MG PO TABS: 1.0000 | ORAL_TABLET | Freq: Four times a day (QID) | ORAL | 0 refills | Status: DC | PRN

## 2019-08-31 MED ORDER — CEFAZOLIN SODIUM-DEXTROSE 2-4 GM/100ML-% IV SOLN
2.0000 g | INTRAVENOUS | Status: AC
  Administered 2019-08-31: 2 g via INTRAVENOUS

## 2019-08-31 MED ORDER — FENTANYL CITRATE (PF) 100 MCG/2ML IJ SOLN
INTRAMUSCULAR | Status: DC | PRN
  Administered 2019-08-31 (×2): 50 ug via INTRAVENOUS

## 2019-08-31 MED ORDER — PHENYLEPHRINE HCL (PRESSORS) 10 MG/ML IV SOLN
INTRAVENOUS | Status: AC
  Filled 2019-08-31: qty 1

## 2019-08-31 MED ORDER — DEXAMETHASONE SODIUM PHOSPHATE 10 MG/ML IJ SOLN
INTRAMUSCULAR | Status: DC | PRN
  Administered 2019-08-31: 10 mg via INTRAVENOUS

## 2019-08-31 MED ORDER — EPHEDRINE 5 MG/ML INJ
INTRAVENOUS | Status: AC
  Filled 2019-08-31: qty 10

## 2019-08-31 MED ORDER — OXYCODONE HCL 5 MG/5ML PO SOLN: 5.0000 mg | Freq: Once | ORAL | Status: DC | PRN

## 2019-08-31 MED ORDER — ONDANSETRON HCL 4 MG/2ML IJ SOLN
INTRAMUSCULAR | Status: AC
  Filled 2019-08-31: qty 2

## 2019-08-31 MED ORDER — DEXMEDETOMIDINE HCL 200 MCG/2ML IV SOLN
INTRAVENOUS | Status: DC | PRN
  Administered 2019-08-31: 20 ug via INTRAVENOUS

## 2019-08-31 MED ORDER — MIDAZOLAM HCL 2 MG/2ML IJ SOLN
INTRAMUSCULAR | Status: AC
  Filled 2019-08-31: qty 2

## 2019-08-31 MED ORDER — FENTANYL CITRATE (PF) 100 MCG/2ML IJ SOLN: 25.0000 ug | INTRAMUSCULAR | Status: DC | PRN

## 2019-08-31 MED ORDER — ONDANSETRON HCL 4 MG/2ML IJ SOLN
INTRAMUSCULAR | Status: DC | PRN
  Administered 2019-08-31: 4 mg via INTRAVENOUS

## 2019-08-31 MED ORDER — MIDAZOLAM HCL 2 MG/2ML IJ SOLN
INTRAMUSCULAR | Status: DC | PRN
  Administered 2019-08-31: 2 mg via INTRAVENOUS

## 2019-08-31 MED ORDER — ONDANSETRON HCL 4 MG/2ML IJ SOLN
4.0000 mg | Freq: Once | INTRAMUSCULAR | Status: AC | PRN
  Administered 2019-08-31: 4 mg via INTRAVENOUS

## 2019-08-31 MED ORDER — LIDOCAINE HCL (CARDIAC) PF 100 MG/5ML IV SOSY
PREFILLED_SYRINGE | INTRAVENOUS | Status: DC | PRN
  Administered 2019-08-31: 100 mg via INTRAVENOUS

## 2019-08-31 MED ORDER — FENTANYL CITRATE (PF) 100 MCG/2ML IJ SOLN
INTRAMUSCULAR | Status: AC
  Filled 2019-08-31: qty 2

## 2019-08-31 MED ORDER — DEXAMETHASONE SODIUM PHOSPHATE 10 MG/ML IJ SOLN
INTRAMUSCULAR | Status: AC
  Filled 2019-08-31: qty 1

## 2019-08-31 MED ORDER — CEFAZOLIN SODIUM-DEXTROSE 2-4 GM/100ML-% IV SOLN
INTRAVENOUS | Status: AC
  Filled 2019-08-31: qty 100

## 2019-08-31 MED ORDER — ACETAMINOPHEN 10 MG/ML IV SOLN: 1000.0000 mg | Freq: Once | INTRAVENOUS | Status: DC | PRN

## 2019-08-31 MED ORDER — LACTATED RINGERS IV SOLN: INTRAVENOUS | Status: DC | PRN

## 2019-08-31 MED ORDER — BUPIVACAINE HCL 0.25 % IJ SOLN
INTRAMUSCULAR | Status: DC | PRN
  Administered 2019-08-31: 8 mL

## 2019-08-31 MED ORDER — DEXMEDETOMIDINE HCL IN NACL 80 MCG/20ML IV SOLN
INTRAVENOUS | Status: AC
  Filled 2019-08-31: qty 20

## 2019-08-31 MED ORDER — OXYCODONE HCL 5 MG PO TABS: 5.0000 mg | ORAL_TABLET | Freq: Once | ORAL | Status: DC | PRN

## 2019-08-31 MED ORDER — LACTATED RINGERS IV SOLN: INTRAVENOUS | Status: DC

## 2019-08-31 SURGICAL SUPPLY — 31 items
BLADE CLIPPER SURG (BLADE) ×1 IMPLANT
BLADE SURG 15 STRL LF DISP TIS (BLADE) ×1 IMPLANT
BLADE SURG 15 STRL SS (BLADE) ×2
BNDG COHESIVE 1X5 TAN NS LF (GAUZE/BANDAGES/DRESSINGS) IMPLANT
BNDG CONFORM 2 STRL LF (GAUZE/BANDAGES/DRESSINGS) ×3 IMPLANT
CANISTER SUCT 1200ML W/VALVE (MISCELLANEOUS) ×3 IMPLANT
CHLORAPREP W/TINT 26 (MISCELLANEOUS) ×3 IMPLANT
COVER WAND RF STERILE (DRAPES) ×3 IMPLANT
DRAPE LAPAROTOMY 77X122 PED (DRAPES) ×3 IMPLANT
ELECT REM PT RETURN 9FT ADLT (ELECTROSURGICAL) ×3
ELECTRODE REM PT RTRN 9FT ADLT (ELECTROSURGICAL) ×1 IMPLANT
GAUZE PETROLATUM 1 X8 (GAUZE/BANDAGES/DRESSINGS) ×3 IMPLANT
GLOVE BIOGEL PI IND STRL 7.5 (GLOVE) ×1 IMPLANT
GLOVE BIOGEL PI INDICATOR 7.5 (GLOVE) ×2
GOWN STRL REUS W/ TWL LRG LVL3 (GOWN DISPOSABLE) ×1 IMPLANT
GOWN STRL REUS W/ TWL XL LVL3 (GOWN DISPOSABLE) ×1 IMPLANT
GOWN STRL REUS W/TWL LRG LVL3 (GOWN DISPOSABLE) ×2
GOWN STRL REUS W/TWL XL LVL3 (GOWN DISPOSABLE) ×2
KIT TURNOVER KIT A (KITS) ×3 IMPLANT
LABEL OR SOLS (LABEL) ×3 IMPLANT
NDL HYPO 25X1 1.5 SAFETY (NEEDLE) ×1 IMPLANT
NEEDLE HYPO 25X1 1.5 SAFETY (NEEDLE) ×3 IMPLANT
NS IRRIG 500ML POUR BTL (IV SOLUTION) ×3 IMPLANT
PACK BASIN MINOR (MISCELLANEOUS) ×3 IMPLANT
SOL PREP PVP 2OZ (MISCELLANEOUS) ×3
SOLUTION PREP PVP 2OZ (MISCELLANEOUS) ×1 IMPLANT
STRETCH NET 2 107126 (MISCELLANEOUS) ×3 IMPLANT
SUT CHROMIC 3 0 SH 27 (SUTURE) ×5 IMPLANT
SUT CHROMIC 4 0 RB 1X27 (SUTURE) ×3 IMPLANT
SUT CHROMIC 4 0 SH 27 (SUTURE) IMPLANT
SYR 10ML LL (SYRINGE) ×3 IMPLANT

## 2019-08-31 NOTE — Transfer of Care (Signed)
Immediate Anesthesia Transfer of Care Note  Patient: Vincent Lopez  Procedure(s) Performed: CIRCUMCISION ADULT (N/A Penis)  Patient Location: PACU  Anesthesia Type:General  Level of Consciousness: sedated  Airway & Oxygen Therapy: Patient Spontanous Breathing and Patient connected to nasal cannula oxygen  Post-op Assessment: Report given to RN and Post -op Vital signs reviewed and stable  Post vital signs: Reviewed and stable  Last Vitals:  Vitals Value Taken Time  BP 143/79 08/31/19 1031  Temp    Pulse 86 08/31/19 1032  Resp 19 08/31/19 1032  SpO2 100 % 08/31/19 1032  Vitals shown include unvalidated device data.  Last Pain:  Vitals:   08/31/19 1032  TempSrc:   PainSc: (P) Asleep         Complications: No apparent anesthesia complications

## 2019-08-31 NOTE — Op Note (Signed)
Date of procedure: 08/31/19  Preoperative diagnosis:  1. Phimosis  Postoperative diagnosis:  1. Phimosis  Procedure: 1. Circumcision  Surgeon: Irineo Axon, MD  Anesthesia: General  Complications: None  Intraoperative findings:  -Inner preputial tissue slightly thickened and blanched; possible BXO  EBL: Minimal  Specimens: Prepuce  Indication: Vincent Lopez is a 24 y.o. patient with progressive tightness of his foreskin with difficulty retracting.  After intercourse he will also have difficulty replacing the foreskin back over the head of the penis.  He also notes some pain with intercourse.  He also notes occasional fissuring and irritation.  After reviewing the management options for treatment, he elected to proceed with the above surgical procedure(s). We have discussed the potential benefits and risks of the procedure, side effects of the proposed treatment, the likelihood of the patient achieving the goals of the procedure, and any potential problems that might occur during the procedure or recuperation. Informed consent has been obtained.  Description of procedure:  The patient was taken to the operating room and general anesthesia was induced.  The patient was placed in the supine position, prepped and draped in the usual sterile fashion, and preoperative antibiotics were administered. A preoperative time-out was performed.   The outline of the corona radiata was marked circumferentially along the outer prepuce.  A circumferential incision was made following this line.  The prepuce was retracted and a second circumferential incision was made approximately 5 mm proximal to the corona radiata.  The intervening sleeve of preputial tissue was sharply excised.  Hemostasis was obtained with cautery.  The frenulum was reapproximated with a  4-0 interrupted chromic suture.  The proximal and distal skin edges were then reapproximated with interrupted 3-0 chromic suture in  quadrants.  A dorsal penile and ring block was then performed with 7 mL of 0.25% plain Sensorcaine  A dressing of Vaseline gauze, Kling and stretch net was then placed  After anesthetic reversal the patient was transported to PACU in stable condition.   Irineo Axon, M.D.

## 2019-08-31 NOTE — Anesthesia Procedure Notes (Signed)
Procedure Name: LMA Insertion Date/Time: 08/31/2019 9:06 AM Performed by: Junious Silk, CRNA Pre-anesthesia Checklist: Patient identified, Patient being monitored, Timeout performed, Emergency Drugs available and Suction available Patient Re-evaluated:Patient Re-evaluated prior to induction Oxygen Delivery Method: Circle system utilized Preoxygenation: Pre-oxygenation with 100% oxygen Induction Type: IV induction Ventilation: Mask ventilation without difficulty LMA: LMA inserted LMA Size: 4.0 Tube type: Oral Number of attempts: 1 Placement Confirmation: positive ETCO2 and breath sounds checked- equal and bilateral Tube secured with: Tape Dental Injury: Teeth and Oropharynx as per pre-operative assessment

## 2019-08-31 NOTE — Anesthesia Postprocedure Evaluation (Signed)
Anesthesia Post Note  Patient: Vincent Lopez  Procedure(s) Performed: CIRCUMCISION ADULT (N/A Penis)  Patient location during evaluation: PACU Anesthesia Type: General Level of consciousness: awake and alert Pain management: pain level controlled Vital Signs Assessment: post-procedure vital signs reviewed and stable Respiratory status: spontaneous breathing, nonlabored ventilation, respiratory function stable and patient connected to nasal cannula oxygen Cardiovascular status: blood pressure returned to baseline and stable Postop Assessment: no apparent nausea or vomiting Anesthetic complications: no     Last Vitals:  Vitals:   08/31/19 1047 08/31/19 1107  BP: 136/86 (!) 152/81  Pulse: 89   Resp: (!) 29 18  Temp:  (!) 36.1 C  SpO2: 100% 100%    Last Pain:  Vitals:   08/31/19 1107  TempSrc: Temporal  PainSc: 0-No pain                 Corinda Gubler

## 2019-08-31 NOTE — Telephone Encounter (Signed)
Appointment given to [facility] in PACU.

## 2019-08-31 NOTE — Interval H&P Note (Signed)
History and Physical Interval Note:  08/31/2019 8:53 AM  Vincent Lopez  has presented today for surgery, with the diagnosis of phimosis.  The various methods of treatment have been discussed with the patient and family. After consideration of risks, benefits and other options for treatment, the patient has consented to  Procedure(s): CIRCUMCISION ADULT (N/A) as a surgical intervention.  The patient's history has been reviewed, patient examined, no change in status, stable for surgery.  I have reviewed the patient's chart and labs.  Questions were answered to the patient's satisfaction.     Jcion Buddenhagen C Joslin Doell

## 2019-08-31 NOTE — Discharge Instructions (Signed)
AMBULATORY SURGERY  DISCHARGE INSTRUCTIONS   1) The drugs that you were given will stay in your system until tomorrow so for the next 24 hours you should not:  A) Drive an automobile B) Make any legal decisions C) Drink any alcoholic beverage   2) You may resume regular meals tomorrow.  Today it is better to start with liquids and gradually work up to solid foods.  You may eat anything you prefer, but it is better to start with liquids, then soup and crackers, and gradually work up to solid foods.   3) Please notify your doctor immediately if you have any unusual bleeding, trouble breathing, redness and pain at the surgery site, drainage, fever, or pain not relieved by medication.    4) Additional Instructions:        Please contact your physician with any problems or Same Day Surgery at [phone], Monday through Friday 6 am to 4 pm, or [place] [facility] number at [phone].Postoperative instructions for circumcision  Wound:  In most cases your incision will have absorbable sutures that run along the course of your incision and will dissolve within the first 10-20 days. Some will fall out even earlier. Expect some redness as the sutures dissolved but this should occur only around the sutures. If there is generalized redness, especially with increasing pain or swelling, let us know. The penis will very likely get "black and blue" as the blood in the tissues spread. Sometimes the whole penis will turn colors. The black and blue is followed by a yellow and brown color. In time, all the discoloration will go away.  Diet:  You may return to your normal diet within 24 hours following your surgery. You may note some mild nausea and possibly vomiting the first 6-8 hours following surgery. This is usually due to the side effects of anesthesia, and will disappear quite soon. I would suggest clear liquids and a very light meal the first evening following your  surgery.  Activity:  Your physical activity should be restricted the first 48 hours. During that time you should remain relatively inactive, moving about only when necessary. During the first 7-10 days following surgery he should avoid lifting any heavy objects (anything greater than 15 pounds), and avoid strenuous exercise. If you work, ask [place] specifically about your restrictions, both for work and home. We will write a note to your employer if needed. No intercourse or masturbation for 4 weeks  Ice packs can be placed on and off over the penis for the first 48 hours to help relieve the pain and keep the swelling down. Frozen peas or corn in a ZipLock bag can be frozen, used and re-frozen. Fifteen minutes on and 15 minutes off is a reasonable schedule.   Dressing:  Your dressing consist of 3 layers: An inner layer of Vaseline gauze around the incision, a gauze dressing around the penis and a mesh dressing to hold in place.  You may remove this dressing in 48 hours.  Remove earlier if it gets saturated with urine or falls over the head of the penis.  If the Vaseline gauze sticks to the incision you may remove in the shower.  Hygiene:  You may shower 48 hours after your surgery. Tub bathing should be restricted until the seventh day.  Medication:  You will be sent home with some type of pain medication. In many cases you will be sent home with a narcotic pain pill (Vicodin or Tylox). If the  pain is not too bad, you may take either Tylenol (acetaminophen) or Advil (ibuprofen) which contain no narcotic agents, and might be tolerated a little better, with fewer side effects. If the pain medication you are sent home with does not control the pain, you will have to let us know. Some narcotic pain medications cannot be given or refilled by a phone call to a pharmacy.  Problems you should report to [place]:   Fever of 101.0 degrees Fahrenheit or greater.  Moderate or severe swelling under the skin  incision or involving the scrotum.  Drug reaction such as hives, a rash, nausea or vomiting.

## 2019-08-31 NOTE — Telephone Encounter (Signed)
-----   Message from Riki Altes, MD sent at 08/31/2019 10:42 AM EDT ----- Regarding: Postop follow-up Please schedule postop follow-up approximately 1 month

## 2019-08-31 NOTE — Anesthesia Preprocedure Evaluation (Signed)
Anesthesia Evaluation  Patient identified by MRN, date of birth, ID band Patient awake    Reviewed: Allergy & Precautions, NPO status , Patient's Chart, lab work & pertinent test results  History of Anesthesia Complications Negative for: history of anesthetic complications  Airway Mallampati: II  TM Distance: >3 FB Neck ROM: Full   Comment: Large beard  Dental no notable dental hx. (+) Teeth Intact, Dental Advisory Given   Pulmonary neg pulmonary ROS, neg sleep apnea, neg COPD, Patient abstained from smoking.Not current smoker,    Pulmonary exam normal breath sounds clear to auscultation       Cardiovascular Exercise Tolerance: Good METS(-) hypertension(-) CAD and (-) Past MI negative cardio ROS  (-) dysrhythmias  Rhythm:Regular Rate:Normal - Systolic murmurs    Neuro/Psych negative neurological ROS  negative psych ROS   GI/Hepatic neg GERD  ,(+)     (-) substance abuse  ,   Endo/Other  neg diabetes  Renal/GU negative Renal ROS     Musculoskeletal   Abdominal   Peds  Hematology   Anesthesia Other Findings Past Medical History: No date: Back pain No date: Complication of anesthesia     Comment:  body aches   Reproductive/Obstetrics                             Anesthesia Physical Anesthesia Plan  ASA: II  Anesthesia Plan: General   Post-op Pain Management:    Induction: Intravenous  PONV Risk Score and Plan: 3 and Ondansetron, Dexamethasone and Midazolam  Airway Management Planned: LMA  Additional Equipment: None  Intra-op Plan:   Post-operative Plan: Extubation in OR  Informed Consent: I have reviewed the patients History and Physical, chart, labs and discussed the procedure including the risks, benefits and alternatives for the proposed anesthesia with the patient or authorized representative who has indicated his/her understanding and acceptance.     Dental  advisory given  Plan Discussed with: CRNA and Surgeon  Anesthesia Plan Comments: (Discussed risks of anesthesia with patient, including PONV, sore throat, lip/dental damage. Rare risks discussed as well, such as cardiorespiratory and neurological sequelae. Patient understands.)        Anesthesia Quick Evaluation

## 2019-09-01 LAB — SURGICAL PATHOLOGY

## 2019-10-01 ENCOUNTER — Ambulatory Visit: Payer: BC Managed Care – PPO | Admitting: Urology

## 2019-10-08 ENCOUNTER — Encounter: Payer: Self-pay | Admitting: Urology

## 2019-10-08 ENCOUNTER — Ambulatory Visit: Payer: BC Managed Care – PPO | Admitting: Urology

## 2021-03-04 ENCOUNTER — Ambulatory Visit
Admission: EM | Admit: 2021-03-04 | Discharge: 2021-03-04 | Disposition: A | Discharge status: Home / Self Care | Payer: 59 | Attending: Emergency Medicine | Admitting: Emergency Medicine

## 2021-03-04 ENCOUNTER — Other Ambulatory Visit: Payer: Self-pay

## 2021-03-04 DIAGNOSIS — R509 Fever, unspecified: Secondary | ICD-10-CM | POA: Diagnosis not present

## 2021-03-04 DIAGNOSIS — B349 Viral infection, unspecified: Secondary | ICD-10-CM | POA: Diagnosis not present

## 2021-03-04 NOTE — Discharge Instructions (Addendum)
Your COVID and Flu tests are pending.    Take Tylenol or ibuprofen as needed for fever or discomfort.  Rest and keep yourself hydrated.    Follow-up with your primary care provider if your symptoms are not improving.     

## 2021-03-04 NOTE — ED Provider Notes (Signed)
Renaldo Fiddler    CSN: [id number] Arrival date & time: 03/04/21  5732      History   Chief Complaint Chief Complaint  Patient presents with   Cough    Pt states that he has a cough, nasal congestion, fever, left ear pain. X 2days     HPI Vincent Lopez is a 25 y.o. male.  Patient presents with 3-day history of fever, ear pain, congestion, sore throat, cough.  He felt warm but did not take his temperature.  He has been treating his symptoms at home with Mucinex day and night but has not taken any medication today.  No rash, shortness of breath, vomiting, diarrhea, or other symptoms.  No pertinent medical history.  The history is provided by the patient.   Past Medical History:  Diagnosis Date   Back pain    Complication of anesthesia    body aches     There are no problems to display for this patient.   Past Surgical History:  Procedure Laterality Date   CHOLECYSTECTOMY  04/2019   CIRCUMCISION N/A 08/31/2019   Procedure: CIRCUMCISION ADULT;  Surgeon: Riki Altes, MD;  Location: [facility] ORS;  Service: Urology;  Laterality: N/A;       Home Medications    Prior to Admission medications   Medication Sig Start Date End Date Taking? Authorizing Provider  oxyCODONE-acetaminophen (PERCOCET/ROXICET) 5-325 MG tablet Take 1 tablet by mouth every 6 (six) hours as needed for severe pain. 08/31/19   Stoioff, Verna Czech, MD    Family History Family History  Problem Relation Age of Onset   Hypertension Mother    Hypertension Father     Social History Social History   Tobacco Use   Smoking status: Never   Smokeless tobacco: Never  Vaping Use   Vaping Use: Never used  Substance Use Topics   Alcohol use: Yes    Comment: occ    Drug use: Never    Comment: Occasional     Allergies   Patient has no known allergies.   Review of Systems Review of Systems  Constitutional:  Positive for fever. Negative for chills.  HENT:  Positive for congestion, ear pain  and sore throat.   Respiratory:  Positive for cough. Negative for shortness of breath.   Cardiovascular:  Negative for chest pain and palpitations.  Gastrointestinal:  Negative for diarrhea and vomiting.  Skin:  Negative for color change and rash.  All other systems reviewed and are negative.   Physical Exam Triage Vital Signs ED Triage Vitals  Enc Vitals Group     BP      Pulse      Resp      Temp      Temp src      SpO2      Weight      Height      Head Circumference      Peak Flow      Pain Score      Pain Loc      Pain Edu?      Excl. in GC?    No data found.  Updated Vital Signs BP (!) 141/96 (BP Location: Left Arm)   Pulse 73   Temp 99.6 F (37.6 C) (Oral)   Resp 20   Ht 5\' 8"  (1.727 m)   Wt 265 lb (120.2 kg)   SpO2 98%   BMI 40.29 kg/m   Visual Acuity Right Eye Distance:   Left  Eye Distance:   Bilateral Distance:    Right Eye Near:   Left Eye Near:    Bilateral Near:     Physical Exam Vitals and nursing note reviewed.  Constitutional:      General: He is not in acute distress.    Appearance: He is well-developed. He is obese. He is not ill-appearing.  HENT:     Head: Normocephalic and atraumatic.     Right Ear: Tympanic membrane normal.     Left Ear: Tympanic membrane normal.     Nose: Nose normal.     Mouth/Throat:     Mouth: Mucous membranes are moist.     Pharynx: Oropharynx is clear.  Cardiovascular:     Rate and Rhythm: Normal rate and regular rhythm.     Heart sounds: Normal heart sounds.  Pulmonary:     Effort: Pulmonary effort is normal. No respiratory distress.     Breath sounds: Normal breath sounds.  Abdominal:     Palpations: Abdomen is soft.     Tenderness: There is no abdominal tenderness.  Musculoskeletal:     Cervical back: Neck supple.  Skin:    General: Skin is warm and dry.  Neurological:     Mental Status: He is alert.  Psychiatric:        Mood and Affect: Mood normal.        Behavior: Behavior normal.      UC Treatments / Results  Labs (all labs ordered are listed, but only abnormal results are displayed) Labs Reviewed  COVID-19, FLU A+B NAA    EKG   Radiology No results found.  Procedures Procedures (including critical care time)  Medications Ordered in UC Medications - No data to display  Initial Impression / Assessment and Plan / UC Course  I have reviewed the triage vital signs and the nursing notes.  Pertinent labs & imaging results that were available during my care of the patient were reviewed by me and considered in my medical decision making (see chart for details).    Viral illness.  Patient is well-appearing and his exam is reassuring.  COVID and Flu pending.  Instructed patient to self quarantine per CDC guidelines.  Discussed symptomatic treatment including Tylenol or ibuprofen, rest, hydration.  Instructed patient to follow up with PCP if symptoms are not improving.  Patient agrees to plan of care.   Final Clinical Impressions(s) / UC Diagnoses   Final diagnoses:  Viral illness     Discharge Instructions      Your COVID and Flu tests are pending.    Take Tylenol or ibuprofen as needed for fever or discomfort.  Rest and keep yourself hydrated.    Follow-up with your primary care provider if your symptoms are not improving.         ED Prescriptions   None    PDMP not reviewed this encounter.   Mickie Bail, NP 03/04/21 [phone]

## 2021-03-04 NOTE — ED Triage Notes (Signed)
Pt states that he has a cough, nasal congestion, fever, and left ear pain. X2 days  Pt states that he had a negative at ome covid test 03/03/21.  Pt states that he is vaccinated for covid.   Pt hasn't had flu vaccine.

## 2021-03-05 LAB — COVID-19, FLU A+B NAA
Influenza A, NAA: DETECTED — AB
Influenza B, NAA: NOT DETECTED
SARS-CoV-2, NAA: NOT DETECTED

## 2021-04-09 ENCOUNTER — Other Ambulatory Visit: Payer: Self-pay

## 2021-04-09 ENCOUNTER — Encounter: Payer: Self-pay | Admitting: Emergency Medicine

## 2021-04-09 ENCOUNTER — Ambulatory Visit: Admission: EM | Admit: 2021-04-09 | Discharge: 2021-04-09 | Payer: 59

## 2021-04-09 DIAGNOSIS — R1030 Lower abdominal pain, unspecified: Secondary | ICD-10-CM

## 2021-04-09 DIAGNOSIS — K529 Noninfective gastroenteritis and colitis, unspecified: Secondary | ICD-10-CM

## 2021-04-09 MED ORDER — ONDANSETRON 4 MG PO TBDP
4.0000 mg | ORAL_TABLET | Freq: Three times a day (TID) | ORAL | 0 refills | Status: DC | PRN
Start: 2021-04-09 — End: 2022-03-12

## 2021-04-09 NOTE — ED Provider Notes (Signed)
Renaldo Fiddler    CSN: [id number] Arrival date & time: 04/09/21  7741      History   Chief Complaint Chief Complaint  Patient presents with   Abdominal Pain   Diarrhea   Bloated    HPI Vincent Lopez is a 25 y.o. male.  Patient presents with lower abdominal pain, nausea, vomiting, and diarrhea since yesterday.  No emesis since yesterday.  Approximately 10 episodes of diarrhea this morning.  Patient states he is not able to eat or drink due to nausea.  He denies fever, rash cough, shortness of breath, dysuria, hematuria, or other symptoms.  His medical history includes back pain and cholecystectomy.  The history is provided by the patient and medical records.   Past Medical History:  Diagnosis Date   Back pain    Complication of anesthesia    body aches     There are no problems to display for this patient.   Past Surgical History:  Procedure Laterality Date   CHOLECYSTECTOMY  04/2019   CIRCUMCISION N/A 08/31/2019   Procedure: CIRCUMCISION ADULT;  Surgeon: Riki Altes, MD;  Location: [facility] ORS;  Service: Urology;  Laterality: N/A;       Home Medications    Prior to Admission medications   Medication Sig Start Date End Date Taking? Authorizing Provider  ondansetron (ZOFRAN-ODT) 4 MG disintegrating tablet Take 1 tablet (4 mg total) by mouth every 8 (eight) hours as needed for nausea or vomiting. 04/09/21  Yes Mickie Bail, NP  oxyCODONE-acetaminophen (PERCOCET/ROXICET) 5-325 MG tablet Take 1 tablet by mouth every 6 (six) hours as needed for severe pain. 08/31/19   Stoioff, Verna Czech, MD  OZEMPIC, 0.25 OR 0.5 MG/DOSE, 2 MG/1.5ML SOPN Inject into the skin. 03/21/21   [provider]    Family History Family History  Problem Relation Age of Onset   Hypertension Mother    Hypertension Father     Social History Social History   Tobacco Use   Smoking status: Never   Smokeless tobacco: Never  Vaping Use   Vaping Use: Never used  Substance  Use Topics   Alcohol use: Yes    Comment: occ    Drug use: Never    Comment: Occasional     Allergies   Patient has no known allergies.   Review of Systems Review of Systems  Constitutional:  Negative for chills and fever.  HENT:  Negative for ear pain and sore throat.   Respiratory:  Negative for cough and shortness of breath.   Cardiovascular:  Negative for chest pain and palpitations.  Gastrointestinal:  Positive for abdominal pain, diarrhea, nausea and vomiting.  Genitourinary:  Negative for dysuria and hematuria.  Skin:  Negative for color change and rash.  All other systems reviewed and are negative.   Physical Exam Triage Vital Signs ED Triage Vitals  Enc Vitals Group     BP 04/09/21 0830 131/84     Pulse Rate 04/09/21 0830 99     Resp 04/09/21 0830 18     Temp 04/09/21 0830 98.9 F (37.2 C)     Temp Source 04/09/21 0830 Oral     SpO2 04/09/21 0830 96 %     Weight --      Height --      Head Circumference --      Peak Flow --      Pain Score 04/09/21 0827 9     Pain Loc --  Pain Edu? --      Excl. in [place]? --    No data found.  Updated Vital Signs BP 131/84 (BP Location: Left Arm)    Pulse 99    Temp 98.9 F (37.2 C) (Oral)    Resp 18    SpO2 96%   Visual Acuity Right Eye Distance:   Left Eye Distance:   Bilateral Distance:    Right Eye Near:   Left Eye Near:    Bilateral Near:     Physical Exam Vitals and nursing note reviewed.  Constitutional:      General: He is not in acute distress.    Appearance: Normal appearance. He is well-developed. He is not ill-appearing.  HENT:     Mouth/Throat:     Mouth: Mucous membranes are moist.  Cardiovascular:     Rate and Rhythm: Normal rate and regular rhythm.     Heart sounds: Normal heart sounds.  Pulmonary:     Effort: Pulmonary effort is normal. No respiratory distress.     Breath sounds: Normal breath sounds.  Abdominal:     General: Bowel sounds are increased.     Palpations: Abdomen is  soft.     Tenderness: There is no abdominal tenderness. There is no guarding or rebound.  Musculoskeletal:     Cervical back: Neck supple.  Skin:    General: Skin is warm and dry.     Capillary Refill: Capillary refill takes less than 2 seconds.  Neurological:     General: No focal deficit present.     Mental Status: He is alert and oriented to person, place, and time.  Psychiatric:        Mood and Affect: Mood normal.        Behavior: Behavior normal.     UC Treatments / Results  Labs (all labs ordered are listed, but only abnormal results are displayed) Labs Reviewed - No data to display  EKG   Radiology No results found.  Procedures Procedures (including critical care time)  Medications Ordered in UC Medications - No data to display  Initial Impression / Assessment and Plan / UC Course  I have reviewed the triage vital signs and the nursing notes.  Pertinent labs & imaging results that were available during my care of the patient were reviewed by me and considered in my medical decision making (see chart for details).  Gastroenteritis, lower abdominal pain.  Patient is well-appearing and his exam is reassuring.  Vital signs are stable.  ED precautions discussed.  Education provided on diarrhea and abdominal pain.  Treating nausea with Zofran.  Instructed patient to treat the diarrhea with OTC antidiarrheal.  Discussed hydration with clear liquids.  Work note provided per patient request.  Patient agrees to plan of care.   Final Clinical Impressions(s) / UC Diagnoses   Final diagnoses:  Gastroenteritis  Lower abdominal pain     Discharge Instructions      Go to the emergency department if you have acute abdominal pain or are unable to keep yourself hydrated.         ED Prescriptions     Medication Sig Dispense Auth. Provider   ondansetron (ZOFRAN-ODT) 4 MG disintegrating tablet Take 1 tablet (4 mg total) by mouth every 8 (eight) hours as needed for  nausea or vomiting. 20 tablet Sharion Balloon, NP      PDMP not reviewed this encounter.   Sharion Balloon, NP 04/09/21 [phone]

## 2021-04-09 NOTE — ED Triage Notes (Addendum)
Pt presents with abdominal pain, diarrhea, bloating and indigestion since yesterday. Pt vomited once yesterday.

## 2021-04-09 NOTE — Discharge Instructions (Addendum)
Go to the emergency department if you have acute abdominal pain or are unable to keep yourself hydrated.

## 2021-05-11 IMAGING — US US ABDOMEN LIMITED
1 series · 14 of 25 positions shown · non-contrast
Comparison: None.

CLINICAL DATA: Right upper quadrant pain and vomiting

EXAM:
ULTRASOUND ABDOMEN LIMITED RIGHT UPPER QUADRANT

[Series 1: us abdomen limited · 14 of 39 slices shown]
[im 1/39]
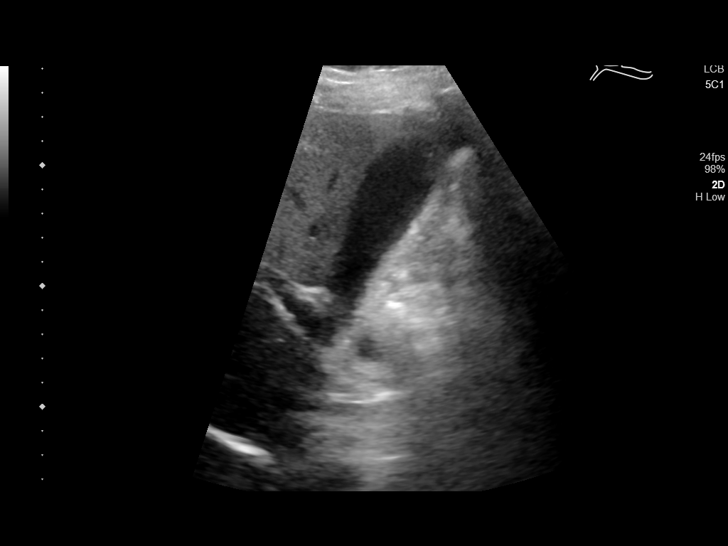
[im 4/39]
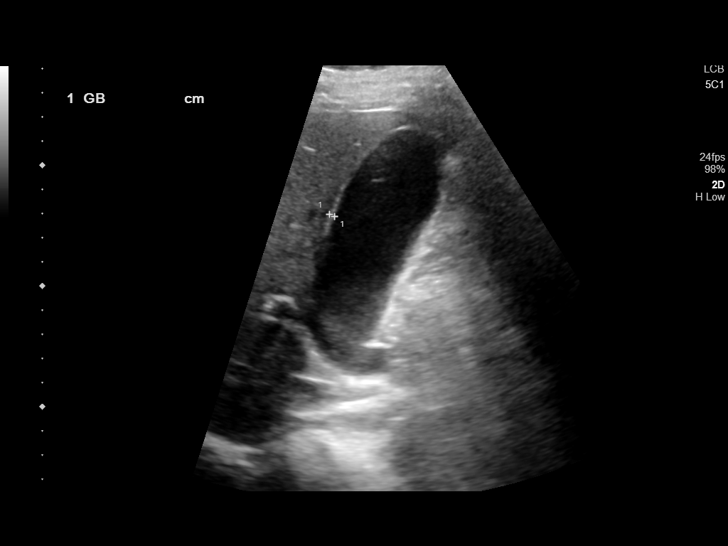
[im 7/39]
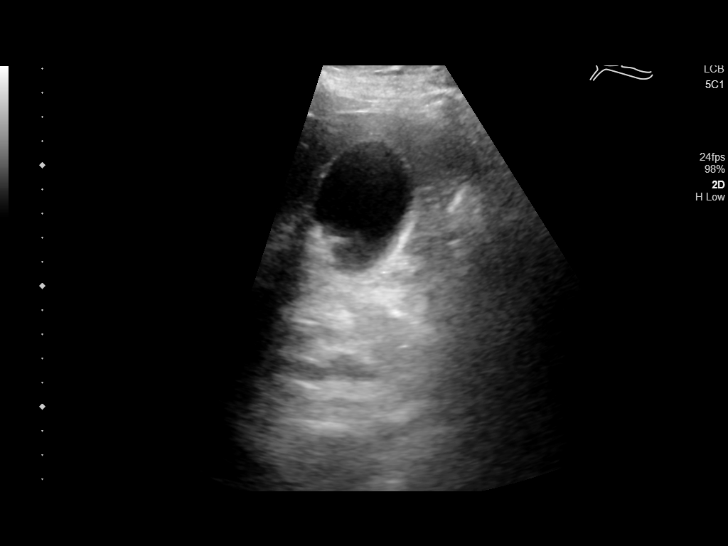
[im 10/39]
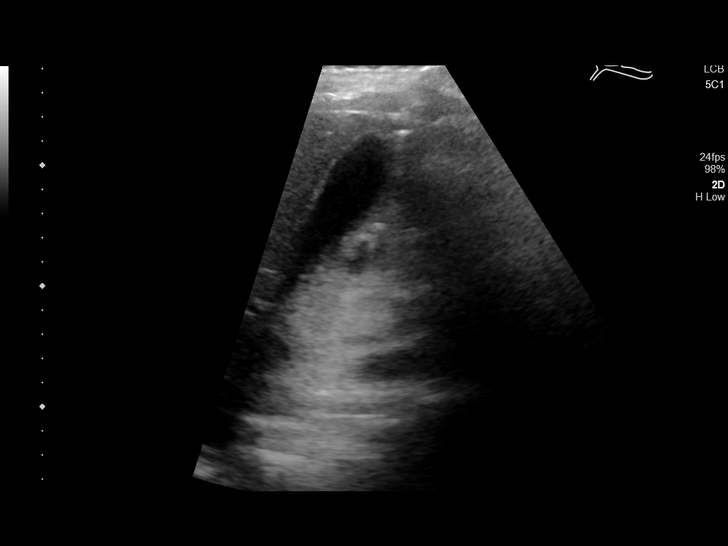
[im 13/39]
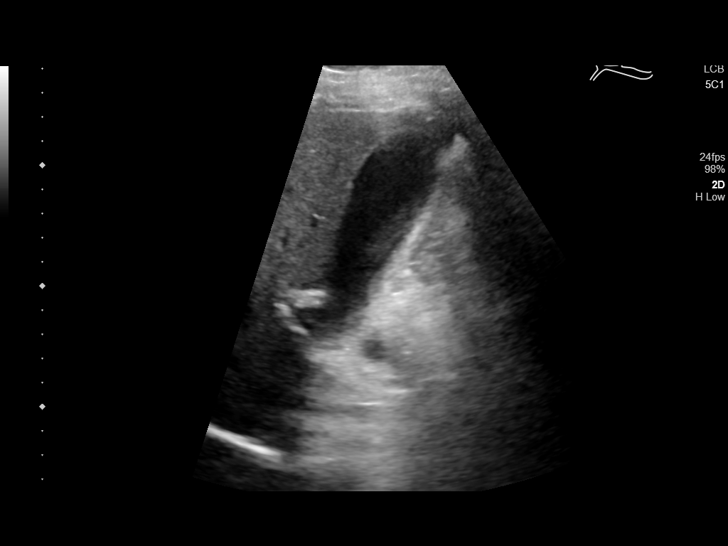
[im 15/39]
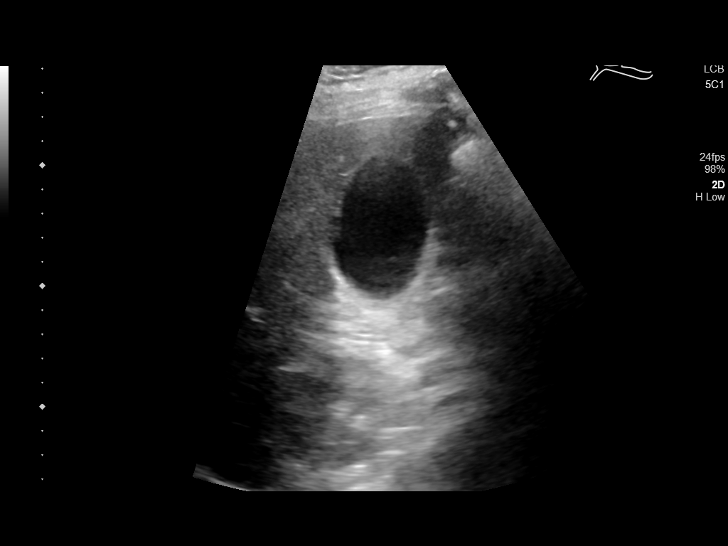
[im 18/39]
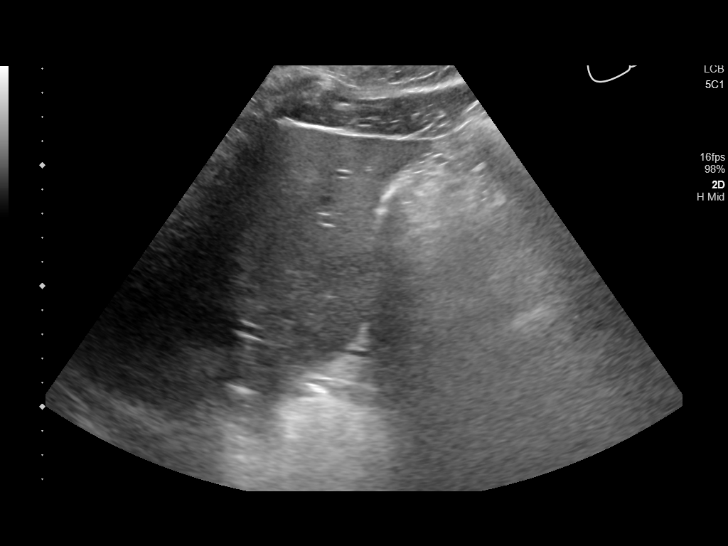
[im 21/39]
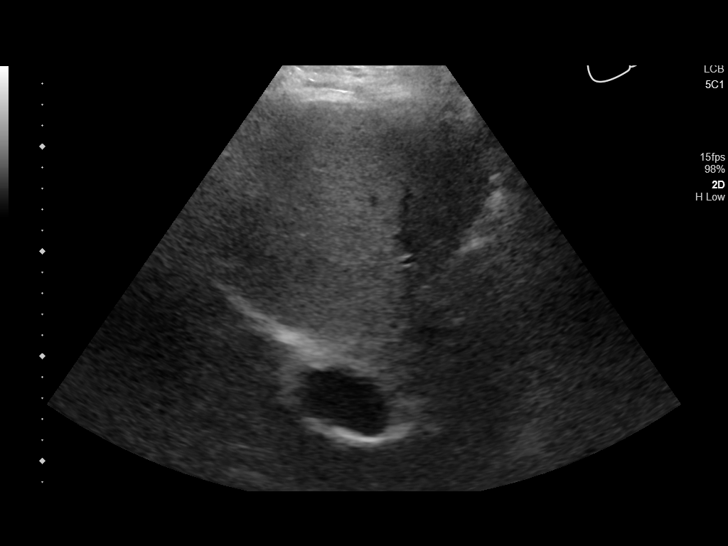
[im 24/39]
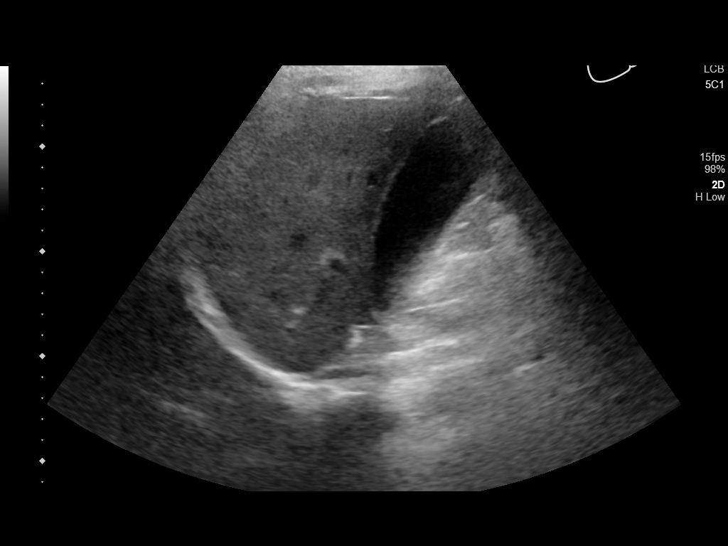
[im 26/39]
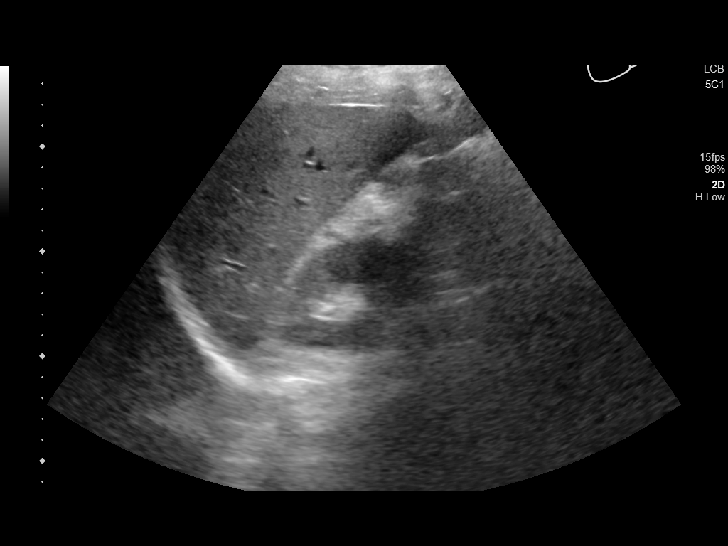
[im 29/39]
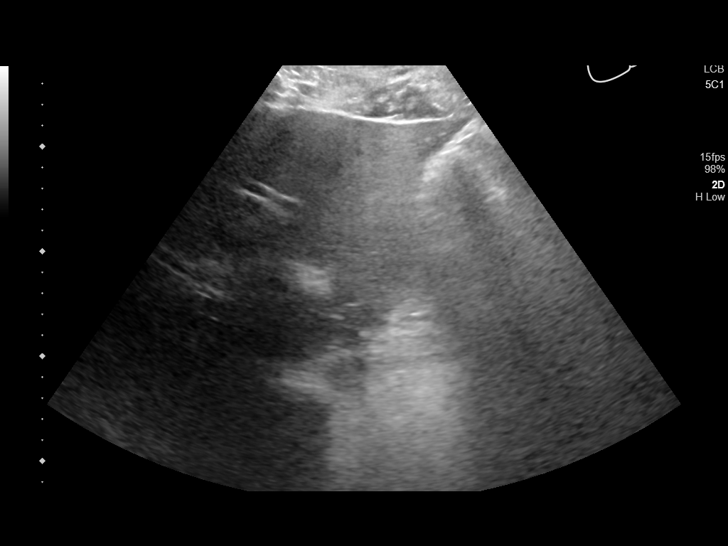
[im 32/39]
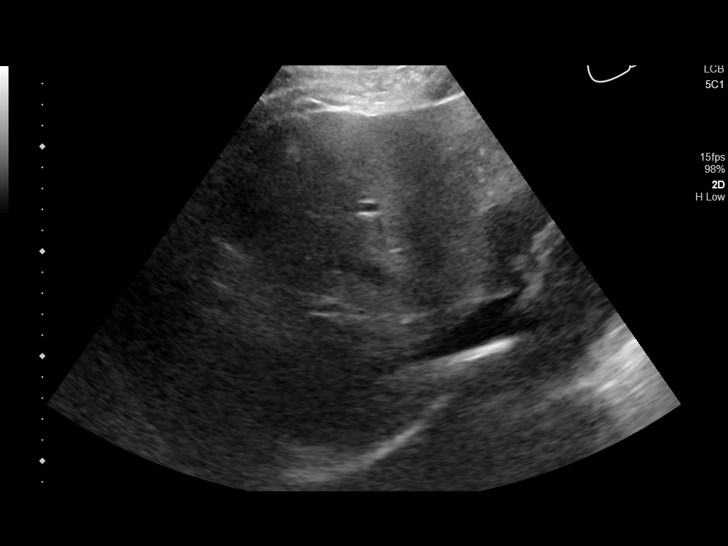
[im 35/39]
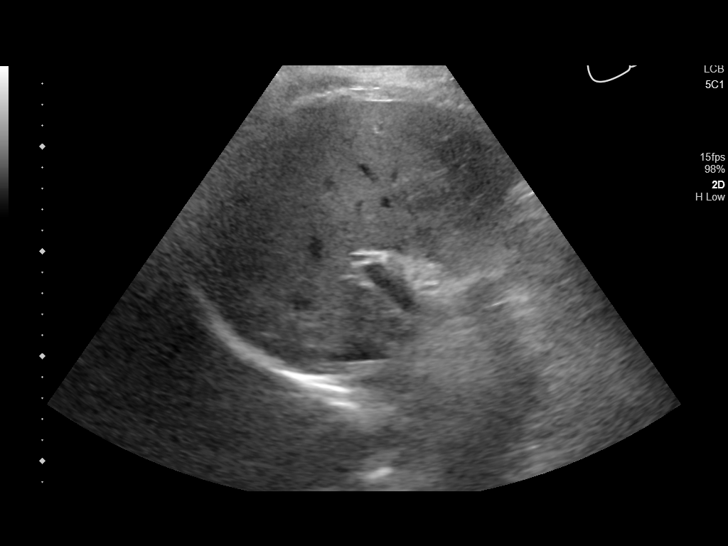
[im 39/39]
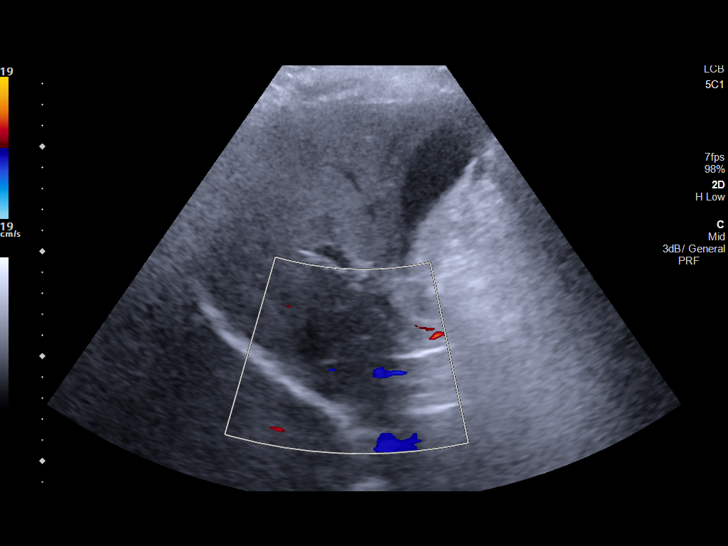

[14 of 25 positions shown; findings below may reference images not displayed]

FINDINGS: Gallbladder:

There is a small amount of gallbladder sludge and small stones. No
gallbladder wall thickening. No sonographic Murphy sign noted by
sonographer.

Common bile duct:

Diameter: 0.4 cm.

Liver:

No focal lesion identified. Within normal limits in parenchymal
echogenicity. Portal vein is patent on color Doppler imaging with
normal direction of blood flow towards the liver.

Other: None.
IMPRESSION: Cholelithiasis without other evidence of cholecystitis.

## 2022-03-12 ENCOUNTER — Encounter (HOSPITAL_COMMUNITY): Payer: Self-pay

## 2022-03-12 ENCOUNTER — Emergency Department (HOSPITAL_COMMUNITY): Payer: Worker's Compensation

## 2022-03-12 ENCOUNTER — Emergency Department (HOSPITAL_COMMUNITY): Payer: BC Managed Care – PPO

## 2022-03-12 ENCOUNTER — Other Ambulatory Visit: Payer: Self-pay

## 2022-03-12 ENCOUNTER — Emergency Department (HOSPITAL_COMMUNITY)
Admission: EM | Admit: 2022-03-12 | Discharge: 2022-03-12 | Disposition: A | Discharge status: Home / Self Care | Payer: Worker's Compensation | Attending: Emergency Medicine | Admitting: Emergency Medicine

## 2022-03-12 DIAGNOSIS — Z23 Encounter for immunization: Secondary | ICD-10-CM | POA: Insufficient documentation

## 2022-03-12 DIAGNOSIS — T1490XA Injury, unspecified, initial encounter: Secondary | ICD-10-CM

## 2022-03-12 DIAGNOSIS — S93401A Sprain of unspecified ligament of right ankle, initial encounter: Secondary | ICD-10-CM | POA: Insufficient documentation

## 2022-03-12 DIAGNOSIS — Y990 Civilian activity done for income or pay: Secondary | ICD-10-CM | POA: Insufficient documentation

## 2022-03-12 DIAGNOSIS — Y92410 Unspecified street and highway as the place of occurrence of the external cause: Secondary | ICD-10-CM | POA: Diagnosis not present

## 2022-03-12 DIAGNOSIS — M549 Dorsalgia, unspecified: Secondary | ICD-10-CM | POA: Insufficient documentation

## 2022-03-12 DIAGNOSIS — R930 Abnormal findings on diagnostic imaging of skull and head, not elsewhere classified: Secondary | ICD-10-CM | POA: Diagnosis not present

## 2022-03-12 DIAGNOSIS — M25561 Pain in right knee: Secondary | ICD-10-CM | POA: Diagnosis not present

## 2022-03-12 DIAGNOSIS — S0001XA Abrasion of scalp, initial encounter: Secondary | ICD-10-CM | POA: Diagnosis not present

## 2022-03-12 DIAGNOSIS — S99911A Unspecified injury of right ankle, initial encounter: Secondary | ICD-10-CM | POA: Diagnosis present

## 2022-03-12 LAB — COMPREHENSIVE METABOLIC PANEL
ALT: 77 U/L — ABNORMAL HIGH (ref 0–44)
AST: 43 U/L — ABNORMAL HIGH (ref 15–41)
Albumin: 4.5 g/dL (ref 3.5–5.0)
Alkaline Phosphatase: 64 U/L (ref 38–126)
Anion gap: 14 (ref 5–15)
BUN: 9 mg/dL (ref 6–20)
CO2: 22 mmol/L (ref 22–32)
Calcium: 9.6 mg/dL (ref 8.9–10.3)
Chloride: 102 mmol/L (ref 98–111)
Creatinine, Ser: 0.91 mg/dL (ref 0.61–1.24)
GFR, Estimated: >60 mL/min (ref >60)
Glucose, Bld: 92 mg/dL (ref 70–99)
Potassium: 4.1 mmol/L (ref 3.5–5.1)
Sodium: 138 mmol/L (ref 135–145)
Total Bilirubin: 0.6 mg/dL (ref 0.3–1.2)
Total Protein: 7.5 g/dL (ref 6.5–8.1)

## 2022-03-12 LAB — CBC
HCT: 47.6 % (ref 39.0–52.0)
Hemoglobin: 16.1 g/dL (ref 13.0–17.0)
MCH: 30.4 pg (ref 26.0–34.0)
MCHC: 33.8 g/dL (ref 30.0–36.0)
MCV: 90 fL (ref 80.0–100.0)
Platelets: 301 10*3/uL (ref 150–400)
RBC: 5.29 MIL/uL (ref 4.22–5.81)
RDW: 12.8 % (ref 11.5–15.5)
WBC: 9.4 10*3/uL (ref 4.0–10.5)
nRBC: 0 % (ref 0.0–0.2)

## 2022-03-12 MED ORDER — IBUPROFEN 400 MG PO TABS
600.0000 mg | ORAL_TABLET | Freq: Once | ORAL | Status: AC
  Administered 2022-03-12: 600 mg via ORAL
  Filled 2022-03-12: qty 1

## 2022-03-12 MED ORDER — TETANUS-DIPHTH-ACELL PERTUSSIS 5-2.5-18.5 LF-MCG/0.5 IM SUSY
0.5000 mL | PREFILLED_SYRINGE | Freq: Once | INTRAMUSCULAR | Status: AC
  Administered 2022-03-12: 0.5 mL via INTRAMUSCULAR
  Filled 2022-03-12: qty 0.5

## 2022-03-12 MED ORDER — ACETAMINOPHEN 325 MG PO TABS
650.0000 mg | ORAL_TABLET | Freq: Once | ORAL | Status: AC
  Administered 2022-03-12: 650 mg via ORAL
  Filled 2022-03-12: qty 2

## 2022-03-12 MED ORDER — OXYCODONE-ACETAMINOPHEN 5-325 MG PO TABS
1.0000 | ORAL_TABLET | Freq: Once | ORAL | Status: AC
  Administered 2022-03-12: 1 via ORAL
  Filled 2022-03-12: qty 1

## 2022-03-12 NOTE — ED Notes (Signed)
DC instructions reviewed with pt. PT verbalized understanding. PT DC °
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
## 2022-03-12 NOTE — ED Notes (Signed)
Pt transported to CT/xray 
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
## 2022-03-12 NOTE — Discharge Instructions (Addendum)
Take ibuprofen and Tylenol as needed for headaches or soreness.  If you have persistent soreness in your ankle or knee, there is a telephone number below that you can call for an orthopedic surgeon follow-up.

## 2022-03-12 NOTE — ED Notes (Signed)
Pt is difficult stick.  Attempted IV x1.  EDP notified and will try [place] IV.

## 2022-03-12 NOTE — ED Notes (Signed)
Pt went to restroom in wheelchair. Complained of some lightheadedness after and that his right knee, which has an abrasion, hurt when bearing weight and continues to hurt in bed.  EDP notified.
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
